# Patient Record
Sex: Female | Born: 1950 | Race: White | Hispanic: No | Marital: Married | Smoking: Former smoker
Health system: Southern US, Community
[De-identification: ages and names within clinical notes are randomized; demographics above are authoritative.]

## PROBLEM LIST (undated history)

## (undated) DIAGNOSIS — C801 Malignant (primary) neoplasm, unspecified: Secondary | ICD-10-CM

## (undated) DIAGNOSIS — M810 Age-related osteoporosis without current pathological fracture: Secondary | ICD-10-CM

## (undated) DIAGNOSIS — I10 Essential (primary) hypertension: Secondary | ICD-10-CM

## (undated) DIAGNOSIS — Z95828 Presence of other vascular implants and grafts: Secondary | ICD-10-CM

## (undated) HISTORY — PX: OTHER SURGICAL HISTORY: SHX169

## (undated) HISTORY — PX: TONSILLECTOMY: SUR1361

## (undated) HISTORY — DX: Presence of other vascular implants and grafts: Z95.828

## (undated) HISTORY — PX: TUMOR EXCISION: SHX421

## (undated) HISTORY — DX: Essential (primary) hypertension: I10

---
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
## 1993-11-18 HISTORY — PX: ABDOMINAL HYSTERECTOMY: SHX81

## 2006-12-12 ENCOUNTER — Ambulatory Visit: Payer: Self-pay | Admitting: Family Medicine

## 2006-12-12 DIAGNOSIS — J069 Acute upper respiratory infection, unspecified: Secondary | ICD-10-CM | POA: Insufficient documentation

## 2006-12-15 ENCOUNTER — Telehealth: Payer: Self-pay | Admitting: Family Medicine

## 2007-01-14 ENCOUNTER — Ambulatory Visit: Payer: Self-pay | Admitting: Family Medicine

## 2007-01-14 LAB — CONVERTED CEMR LAB: Inflenza A Ag: POSITIVE

## 2007-01-21 ENCOUNTER — Ambulatory Visit: Payer: Self-pay | Admitting: Family Medicine

## 2007-07-15 ENCOUNTER — Ambulatory Visit: Payer: Self-pay | Admitting: Family Medicine

## 2007-07-15 DIAGNOSIS — J329 Chronic sinusitis, unspecified: Secondary | ICD-10-CM | POA: Insufficient documentation

## 2007-08-26 ENCOUNTER — Encounter: Payer: Self-pay | Admitting: Family Medicine

## 2007-08-27 ENCOUNTER — Ambulatory Visit: Payer: Self-pay | Admitting: Family Medicine

## 2007-08-27 DIAGNOSIS — M549 Dorsalgia, unspecified: Secondary | ICD-10-CM | POA: Insufficient documentation

## 2007-09-03 ENCOUNTER — Encounter: Admission: RE | Admit: 2007-09-03 | Discharge: 2007-09-03 | Payer: Self-pay | Admitting: Family Medicine

## 2007-09-23 ENCOUNTER — Ambulatory Visit: Payer: Self-pay | Admitting: Family Medicine

## 2007-09-28 ENCOUNTER — Telehealth: Payer: Self-pay | Admitting: Family Medicine

## 2007-09-30 ENCOUNTER — Ambulatory Visit: Payer: Self-pay | Admitting: Family Medicine

## 2007-10-08 ENCOUNTER — Encounter: Payer: Self-pay | Admitting: Family Medicine

## 2007-11-19 HISTORY — PX: NASAL SINUS SURGERY: SHX719

## 2008-02-24 ENCOUNTER — Ambulatory Visit: Payer: Self-pay | Admitting: Family Medicine

## 2008-02-24 DIAGNOSIS — R002 Palpitations: Secondary | ICD-10-CM | POA: Insufficient documentation

## 2008-02-24 DIAGNOSIS — H1045 Other chronic allergic conjunctivitis: Secondary | ICD-10-CM

## 2008-04-27 ENCOUNTER — Ambulatory Visit: Payer: Self-pay | Admitting: Family Medicine

## 2008-04-27 DIAGNOSIS — J441 Chronic obstructive pulmonary disease with (acute) exacerbation: Secondary | ICD-10-CM | POA: Insufficient documentation

## 2008-04-27 DIAGNOSIS — J019 Acute sinusitis, unspecified: Secondary | ICD-10-CM | POA: Insufficient documentation

## 2008-05-02 ENCOUNTER — Encounter: Payer: Self-pay | Admitting: Family Medicine

## 2008-05-03 ENCOUNTER — Encounter: Payer: Self-pay | Admitting: Family Medicine

## 2008-06-13 ENCOUNTER — Encounter: Payer: Self-pay | Admitting: Family Medicine

## 2008-08-24 ENCOUNTER — Encounter: Payer: Self-pay | Admitting: Family Medicine

## 2008-09-21 ENCOUNTER — Encounter: Payer: Self-pay | Admitting: Family Medicine

## 2008-09-27 ENCOUNTER — Ambulatory Visit: Payer: Self-pay | Admitting: Family Medicine

## 2008-12-29 ENCOUNTER — Ambulatory Visit: Payer: Self-pay | Admitting: Family Medicine

## 2009-01-03 ENCOUNTER — Telehealth: Payer: Self-pay | Admitting: Family Medicine

## 2009-01-11 ENCOUNTER — Ambulatory Visit: Payer: Self-pay | Admitting: Family Medicine

## 2009-01-11 ENCOUNTER — Encounter: Admission: RE | Admit: 2009-01-11 | Discharge: 2009-01-11 | Payer: Self-pay | Admitting: Family Medicine

## 2009-01-11 ENCOUNTER — Telehealth: Payer: Self-pay | Admitting: Family Medicine

## 2009-01-11 DIAGNOSIS — R030 Elevated blood-pressure reading, without diagnosis of hypertension: Secondary | ICD-10-CM | POA: Insufficient documentation

## 2009-01-12 ENCOUNTER — Telehealth (INDEPENDENT_AMBULATORY_CARE_PROVIDER_SITE_OTHER): Payer: Self-pay | Admitting: *Deleted

## 2009-01-18 ENCOUNTER — Encounter: Payer: Self-pay | Admitting: Family Medicine

## 2009-01-18 ENCOUNTER — Encounter: Admission: RE | Admit: 2009-01-18 | Discharge: 2009-01-18 | Payer: Self-pay | Admitting: Family Medicine

## 2009-01-19 ENCOUNTER — Telehealth: Payer: Self-pay | Admitting: Family Medicine

## 2009-02-15 ENCOUNTER — Ambulatory Visit: Payer: Self-pay | Admitting: Family Medicine

## 2009-03-01 ENCOUNTER — Ambulatory Visit: Payer: Self-pay | Admitting: Family Medicine

## 2009-03-01 DIAGNOSIS — M25519 Pain in unspecified shoulder: Secondary | ICD-10-CM

## 2009-03-02 ENCOUNTER — Telehealth (INDEPENDENT_AMBULATORY_CARE_PROVIDER_SITE_OTHER): Payer: Self-pay | Admitting: *Deleted

## 2009-04-13 ENCOUNTER — Telehealth (INDEPENDENT_AMBULATORY_CARE_PROVIDER_SITE_OTHER): Payer: Self-pay | Admitting: *Deleted

## 2009-04-20 ENCOUNTER — Telehealth: Payer: Self-pay | Admitting: Family Medicine

## 2009-05-04 ENCOUNTER — Telehealth: Payer: Self-pay | Admitting: Family Medicine

## 2009-05-04 DIAGNOSIS — R599 Enlarged lymph nodes, unspecified: Secondary | ICD-10-CM | POA: Insufficient documentation

## 2009-05-05 ENCOUNTER — Encounter: Payer: Self-pay | Admitting: Family Medicine

## 2009-05-05 ENCOUNTER — Encounter: Admission: RE | Admit: 2009-05-05 | Discharge: 2009-05-05 | Payer: Self-pay | Admitting: Family Medicine

## 2009-05-05 LAB — CONVERTED CEMR LAB
BUN: 10 mg/dL (ref 6–23)
Creatinine, Ser: 0.8 mg/dL (ref 0.40–1.20)

## 2009-05-17 ENCOUNTER — Encounter: Payer: Self-pay | Admitting: Family Medicine

## 2009-05-18 LAB — CONVERTED CEMR LAB
Cholesterol, target level: 200 mg/dL
Cholesterol: 239 mg/dL — ABNORMAL HIGH (ref 0–200)
HDL: 63 mg/dL (ref >39)
Total CHOL/HDL Ratio: 3.8
VLDL: 35 mg/dL (ref 0–40)

## 2010-03-08 ENCOUNTER — Encounter: Payer: Self-pay | Admitting: Family Medicine

## 2010-12-18 NOTE — Letter (Signed)
Summary: [facility]  [facility]   Imported By: Lanelle Bal 04/05/2010 08:54:28  _____________________________________________________________________  External Attachment:    Type:   Image     Comment:   External Document

## 2011-04-24 LAB — HM DEXA SCAN

## 2012-01-06 DIAGNOSIS — C159 Malignant neoplasm of esophagus, unspecified: Secondary | ICD-10-CM | POA: Insufficient documentation

## 2012-04-17 ENCOUNTER — Emergency Department (INDEPENDENT_AMBULATORY_CARE_PROVIDER_SITE_OTHER)
Admission: EM | Admit: 2012-04-17 | Discharge: 2012-04-17 | Disposition: A | Discharge status: Home / Self Care | Payer: BC Managed Care – PPO | Source: Home / Self Care | Attending: Emergency Medicine | Admitting: Emergency Medicine

## 2012-04-17 ENCOUNTER — Encounter: Payer: Self-pay | Admitting: Emergency Medicine

## 2012-04-17 DIAGNOSIS — B3731 Acute candidiasis of vulva and vagina: Secondary | ICD-10-CM

## 2012-04-17 DIAGNOSIS — B373 Candidiasis of vulva and vagina: Secondary | ICD-10-CM

## 2012-04-17 HISTORY — DX: Malignant (primary) neoplasm, unspecified: C80.1

## 2012-04-17 LAB — POCT URINALYSIS DIP (MANUAL ENTRY)
Blood, UA: NEGATIVE
Glucose, UA: NEGATIVE
Ketones, POC UA: NEGATIVE
Nitrite, UA: NEGATIVE
Spec Grav, UA: >=1.03 (ref 1.005–1.03)
pH, UA: 5 (ref 5–8)

## 2012-04-17 MED ORDER — FLUCONAZOLE 150 MG PO TABS: ORAL_TABLET | ORAL | Status: DC

## 2012-04-17 NOTE — ED Provider Notes (Signed)
History     CSN: [id number]  Arrival date & time 04/17/12  1659   First MD Initiated Contact with Patient 04/17/12 1659      Chief Complaint  Patient presents with  . Dysuria    (Consider location/radiation/quality/duration/timing/severity/associated sxs/prior treatment) HPI Morgan Butler is a 61 y.o. female who presents today with UTI symptoms for 1 days.  + dysuria (constant, not just with urination) + frequency No urgency No hematuria No vaginal discharge No fever/chills No lower abdominal pain No back pain No fatigue   Past Medical History  Diagnosis Date  . Cancer     Past Surgical History  Procedure Date  . Appendectomy   . Abdominal hysterectomy   . Tonsillectomy   . Left shoulder   . Nasal sinus surgery     No family history on file.  History  Substance Use Topics  . Smoking status: Former Games developer  . Smokeless tobacco: Not on file  . Alcohol Use: Yes    OB History    Grav Para Term Preterm Abortions TAB SAB Ect Mult Living                  Review of Systems  All other systems reviewed and are negative.    Allergies  Review of patient's allergies indicates no known allergies.  Home Medications   Current Outpatient Rx  Name Route Sig Dispense Refill  . FLUCONAZOLE 150 MG PO TABS  May repeat in 3 days 2 tablet 0    BP 132/85  Pulse 73  Temp(Src) 98.6 F (37 C) (Oral)  Resp 16  Ht 5\' 3"  (1.6 m)  Wt 153 lb (69.4 kg)  BMI 27.10 kg/m2  SpO2 98%  Physical Exam  Nursing note and vitals reviewed. Constitutional: She is oriented to person, place, and time. She appears well-developed and well-nourished.  HENT:  Head: Normocephalic and atraumatic.  Eyes: No scleral icterus.  Neck: Neck supple.  Cardiovascular: Regular rhythm and normal heart sounds.   Pulmonary/Chest: Effort normal and breath sounds normal. No respiratory distress.  Abdominal: Soft. Normal appearance and bowel sounds are normal. She exhibits no mass. There is no rebound, no  guarding and no CVA tenderness.  Neurological: She is alert and oriented to person, place, and time.  Skin: Skin is warm and dry.  Psychiatric: She has a normal mood and affect. Her speech is normal.    ED Course  Procedures (including critical care time)   Labs Reviewed  POCT URINALYSIS DIP (MANUAL ENTRY)   No results found.   1. Yeast vaginitis       MDM  1) the urine appeared to be clean.  Likely his yeast vaginitis.  We'll treat with Diflucan.  A urine culture is pending.  The differential diagnosis includes bacterial vaginitis. 2) A urinalysis was done in clinic.  A urine culture is pending. 3) Follow up with your PCP or urologist if not improving or if worsening symptoms.   Marlaine Hind, MD 04/17/12 [phone]

## 2012-04-19 LAB — URINE CULTURE: Colony Count: >=100000

## 2012-04-22 ENCOUNTER — Telehealth: Payer: Self-pay | Admitting: Emergency Medicine

## 2012-05-16 ENCOUNTER — Emergency Department (INDEPENDENT_AMBULATORY_CARE_PROVIDER_SITE_OTHER)
Admission: EM | Admit: 2012-05-16 | Discharge: 2012-05-16 | Disposition: A | Discharge status: Home / Self Care | Payer: BC Managed Care – PPO | Source: Home / Self Care | Attending: Emergency Medicine | Admitting: Emergency Medicine

## 2012-05-16 ENCOUNTER — Emergency Department
Admit: 2012-05-16 | Discharge: 2012-05-16 | Disposition: A | Discharge status: Home / Self Care | Payer: BC Managed Care – PPO

## 2012-05-16 DIAGNOSIS — R079 Chest pain, unspecified: Secondary | ICD-10-CM

## 2012-05-16 DIAGNOSIS — J329 Chronic sinusitis, unspecified: Secondary | ICD-10-CM

## 2012-05-16 DIAGNOSIS — R0781 Pleurodynia: Secondary | ICD-10-CM

## 2012-05-16 DIAGNOSIS — J069 Acute upper respiratory infection, unspecified: Secondary | ICD-10-CM

## 2012-05-16 IMAGING — CR DG RIBS W/ CHEST 3+V*L*
3 series · 3 of 3 positions shown · non-contrast
Comparison: [DATE]

CLINICAL DATA: Left rib pain.  Assess for fracture.

LEFT RIBS AND CHEST - 3+ VIEW

[view not recorded (1 of 3)]
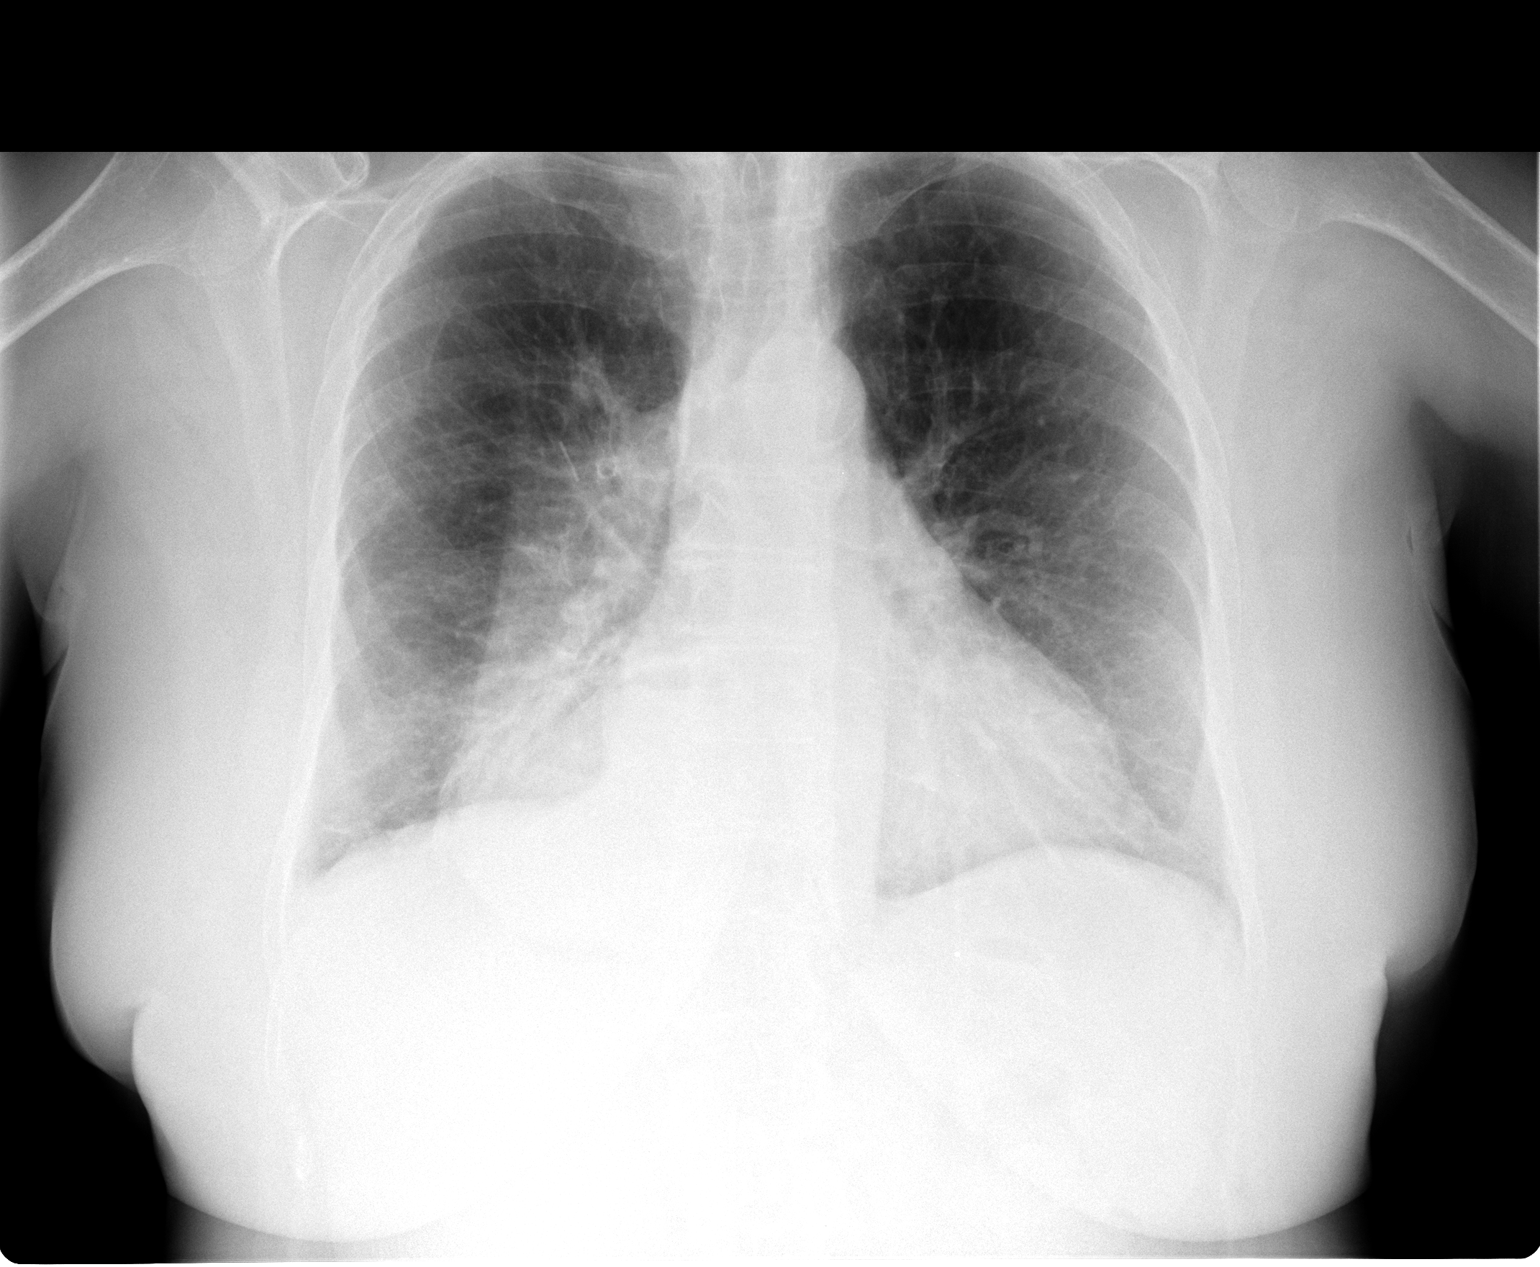

[view not recorded (2 of 3)]
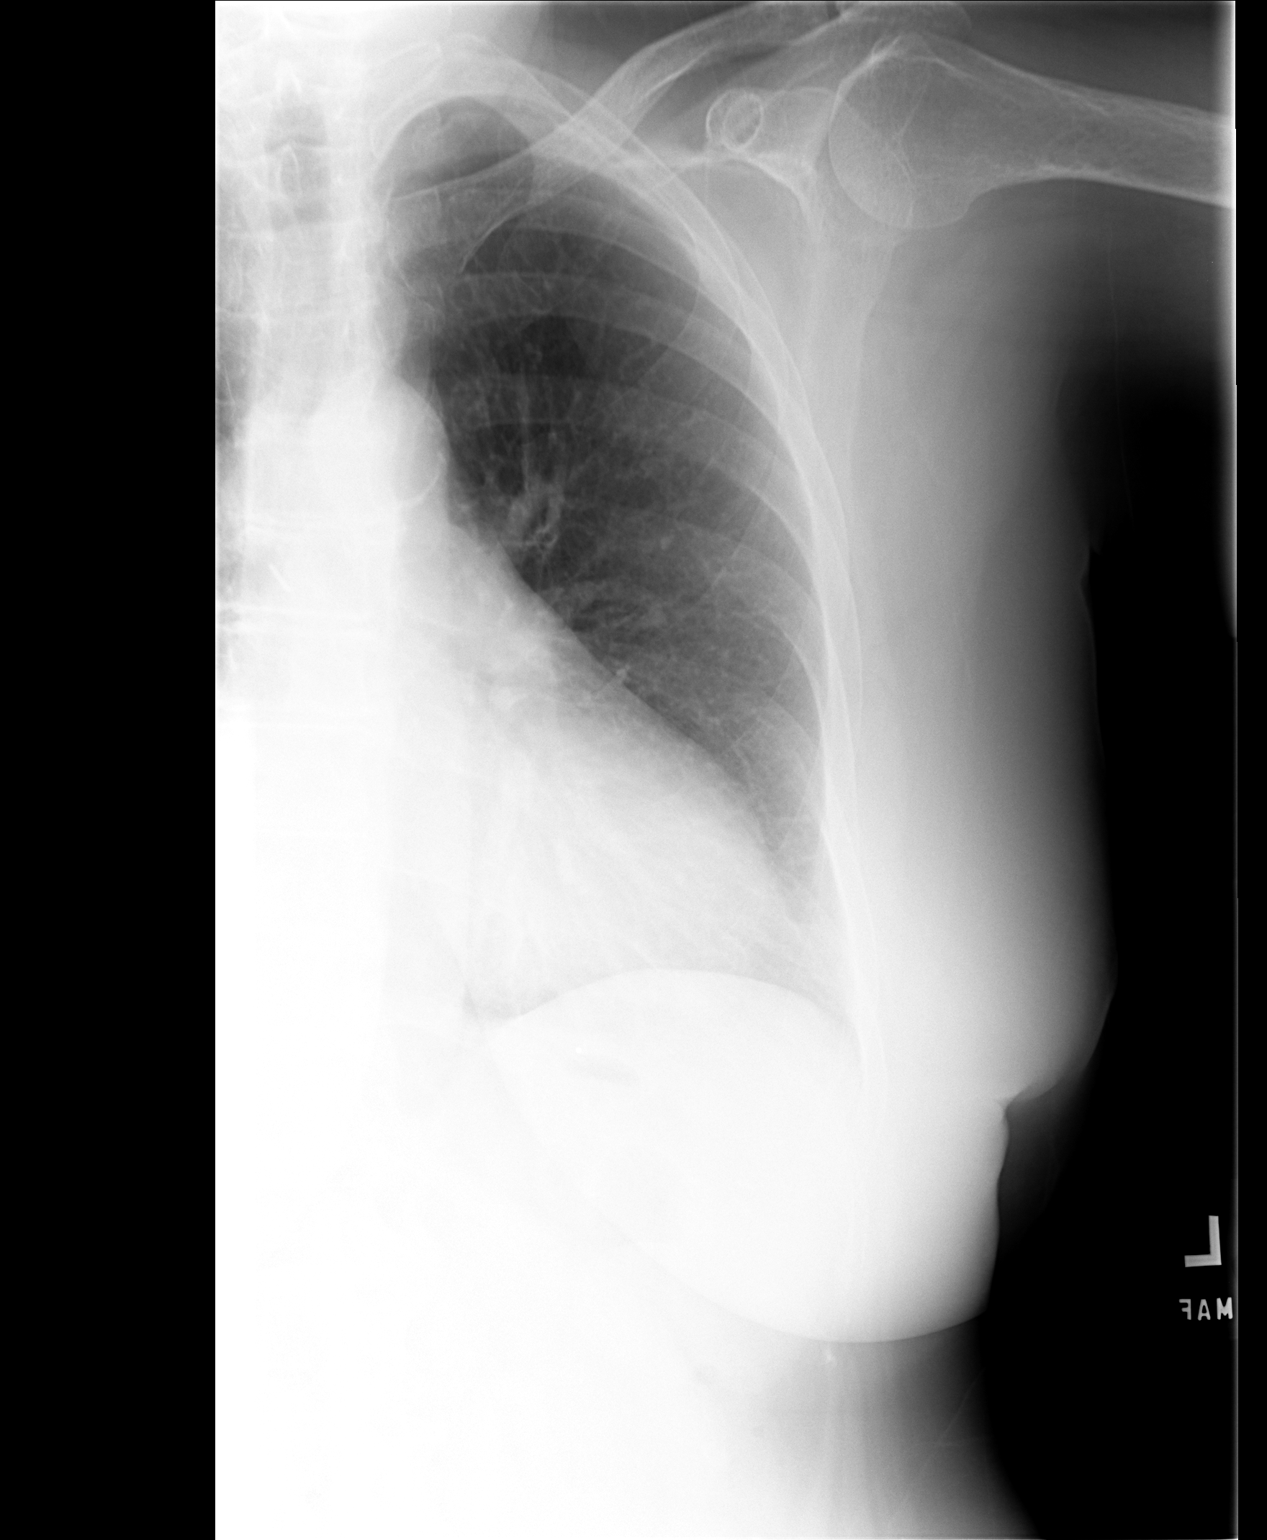

[view not recorded (3 of 3)]
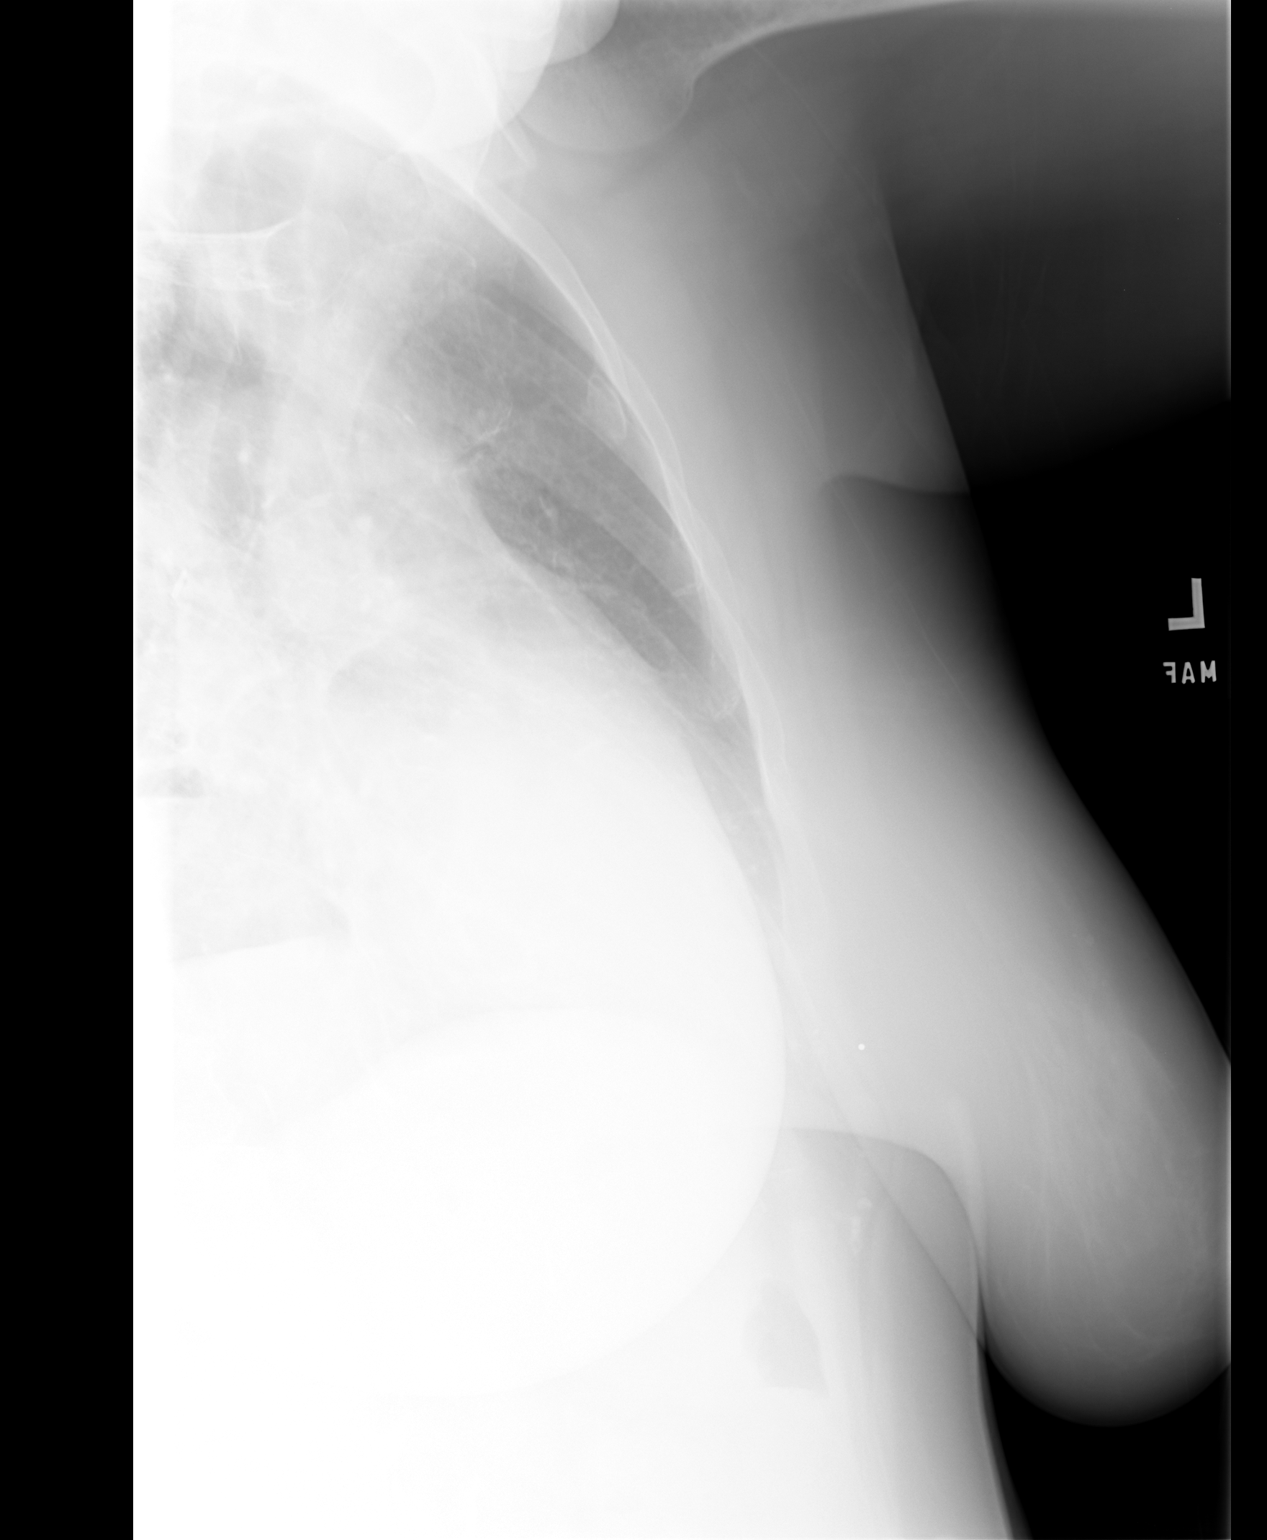

[3 of 3 positions shown; findings below may reference images not displayed]

FINDINGS: Heart size is normal.

No pleural effusion or edema.

Postoperative change compatible with partial esophagectomy and
gastric pull-through is noted.

There is no airspace consolidation.

Rib detail is somewhat diminished secondary to body habitus.  The
coned-down views of the left ribs show no displaced rib fractures.
IMPRESSION: 1.  No acute findings.

## 2012-05-16 MED ORDER — AMOXICILLIN-POT CLAVULANATE 875-125 MG PO TABS: 1.0000 | ORAL_TABLET | Freq: Two times a day (BID) | ORAL | Status: AC

## 2012-05-16 NOTE — ED Provider Notes (Signed)
History     CSN: [id number]  Arrival date & time 05/16/12  0908   First MD Initiated Contact with Patient 05/16/12 0914      Chief Complaint  Patient presents with  . Facial Pain  . Rib Injury    (Consider location/radiation/quality/duration/timing/severity/associated sxs/prior treatment) HPI Morgan Butler is a 61 y.o. female who complains of onset of cold symptoms for a few weeks.  The symptoms are constant and mild-moderate in severity.  She has been using any pot and over-the-counter sinus medicine which has been helping a little bit.  No sore throat No cough No pleuritic pain No wheezing + nasal congestion + post-nasal drainage + sinus pain/pressure No chest congestion No itchy/red eyes No earache No hemoptysis No SOB + chills/sweats No fever No nausea No vomiting No abdominal pain No diarrhea No skin rashes No fatigue No myalgias No headache    2) Her second complaint today is left-sided chest pain.  She was playing with her grandchild and was laying on the ground.  She rolled over and thinks she heard a snap.  Since then she's been having left rib pain focally.  She describes it as a sharp pain and is worse with deep breaths.  She reports having osteoporosis.  No difficulty breathing, radiation of pain, abdominal pain.  No cardiac symptoms or distress.  Past Medical History  Diagnosis Date  . Cancer     Past Surgical History  Procedure Date  . Appendectomy   . Abdominal hysterectomy   . Tonsillectomy   . Left shoulder   . Nasal sinus surgery     History reviewed. No pertinent family history.  History  Substance Use Topics  . Smoking status: Former Games developer  . Smokeless tobacco: Not on file  . Alcohol Use: Yes    OB History    Grav Para Term Preterm Abortions TAB SAB Ect Mult Living                  Review of Systems  All other systems reviewed and are negative.    Allergies  Review of patient's allergies indicates no known allergies.  Home  Medications   Current Outpatient Rx  Name Route Sig Dispense Refill  . AMOXICILLIN-POT CLAVULANATE 875-125 MG PO TABS Oral Take 1 tablet by mouth 2 (two) times daily. 14 tablet 0  . FLUCONAZOLE 150 MG PO TABS  May repeat in 3 days 2 tablet 0    BP 152/83  Pulse 60  Temp 98.4 F (36.9 C) (Oral)  Resp 18  Ht 5' 2.5" (1.588 m)  Wt 155 lb 8 oz (70.534 kg)  BMI 27.99 kg/m2  SpO2 97%  Physical Exam  Nursing note and vitals reviewed. Constitutional: She is oriented to person, place, and time. She appears well-developed and well-nourished.  HENT:  Head: Normocephalic and atraumatic.  Right Ear: Tympanic membrane, external ear and ear canal normal.  Left Ear: Tympanic membrane, external ear and ear canal normal.  Nose: Mucosal edema and rhinorrhea present.  Mouth/Throat: Posterior oropharyngeal erythema present. No oropharyngeal exudate or posterior oropharyngeal edema.  Eyes: No scleral icterus.  Neck: Neck supple.  Cardiovascular: Regular rhythm and normal heart sounds.   Pulmonary/Chest: Effort normal and breath sounds normal. No respiratory distress. She has no decreased breath sounds. She has no wheezes. She has no rhonchi.    Neurological: She is alert and oriented to person, place, and time.  Skin: Skin is warm and dry.  Psychiatric: She has a normal mood and  affect. Her speech is normal.    ED Course  Procedures (including critical care time)  Labs Reviewed - No data to display Dg Ribs Unilateral W/chest Left  05/16/2012  *RADIOLOGY REPORT*  Clinical Data: Left rib pain.  Assess for fracture.  LEFT RIBS AND CHEST - 3+ VIEW  Comparison: 05/05/2009  Findings: Heart size is normal.  No pleural effusion or edema.  Postoperative change compatible with partial esophagectomy and gastric pull-through is noted.  There is no airspace consolidation.  Rib detail is somewhat diminished secondary to body habitus.  The coned-down views of the left ribs show no displaced rib fractures.   IMPRESSION:  1.  No acute findings.  Original Report Authenticated By: Rosealee Albee, M.D.     1. Rib pain   2. Sinusitis   3. Acute upper respiratory infections of unspecified site       MDM  1)  Take the prescribed antibiotic as instructed. Use nasal saline solution (over the counter) at least 3 times a day. Use over the counter decongestants like Zyrtec-D every 12 hours as needed to help with congestion.  If you have hypertension, do not take medicines with sudafed. Can take tylenol every 6 hours or motrin every 8 hours for pain or fever.Follow up with your primary doctor if no improvement in 5-7 days, sooner if increasing pain, fever, or new symptoms.    2)  For her rib pain, since she is postmenopausal with osteoporosis, we did obtain an x-ray and is read by radiology as above.  Because of her possible susceptibility to pneumonia, I did not put her in a rib belt. Motrin or Aleve and continue deep breaths.  Follow up with her PCP if not improving or to ER if worsening pain or symptoms.   Marlaine Hind, MD 05/16/12 1028

## 2012-05-16 NOTE — ED Notes (Signed)
Hx of sinus problems, states it is just getting worse.  Also states she rolled over last night and heard a pop now c/o left rib pain

## 2012-07-27 ENCOUNTER — Emergency Department (INDEPENDENT_AMBULATORY_CARE_PROVIDER_SITE_OTHER)
Admission: EM | Admit: 2012-07-27 | Discharge: 2012-07-27 | Disposition: A | Discharge status: Home / Self Care | Payer: BC Managed Care – PPO | Source: Home / Self Care

## 2012-07-27 ENCOUNTER — Encounter: Payer: Self-pay | Admitting: *Deleted

## 2012-07-27 DIAGNOSIS — J329 Chronic sinusitis, unspecified: Secondary | ICD-10-CM

## 2012-07-27 HISTORY — DX: Age-related osteoporosis without current pathological fracture: M81.0

## 2012-07-27 MED ORDER — AZITHROMYCIN 250 MG PO TABS: ORAL_TABLET | ORAL | Status: AC

## 2012-07-27 MED ORDER — PREDNISONE 50 MG PO TABS: ORAL_TABLET | ORAL | Status: AC

## 2012-07-27 NOTE — ED Notes (Signed)
X 3 weeks, patient c/o productive cough, nasal congestion, HA, sinus pain and drainage. Taken IBF, Theraflu, allegra and Nyquil OTC

## 2012-07-27 NOTE — ED Provider Notes (Signed)
History     CSN: [id number]  Arrival date & time 07/27/12  1547   First MD Initiated Contact with Patient 07/27/12 1551      Chief Complaint  Patient presents with  . Sinusitis  . Cough   HPI SINUSITIS Onset:  2-3 weeks Location: frontal sinuses Description:nasal congestion, sinus pressure, post nasal drip, cough  Modifying factors: no  Symptoms Cough:  yes Discharge:  mild Fever: no Sinus Pressure:  yes Ears Blocked:  no Teeth Ache:  no Frontal Headache:  mild Second Sickening:  no  Red Flags Change in mental state: no Change in vision: no    Past Medical History  Diagnosis Date  . Cancer     esophageal  . Osteoporosis     Past Surgical History  Procedure Date  . Appendectomy   . Abdominal hysterectomy   . Tonsillectomy   . Left shoulder   . Nasal sinus surgery   . Tumor excision     Family History  Problem Relation Age of Onset  . Kidney failure Mother     History  Substance Use Topics  . Smoking status: Former Games developer  . Smokeless tobacco: Not on file  . Alcohol Use: Yes    OB History    Grav Para Term Preterm Abortions TAB SAB Ect Mult Living                  Review of Systems  All other systems reviewed and are negative.    Allergies  Review of patient's allergies indicates no known allergies.  Home Medications   Current Outpatient Rx  Name Route Sig Dispense Refill  . CALCIUM-VITAMIN D 250-125 MG-UNIT PO TABS Oral Take 1 tablet by mouth daily.    Marland Kitchen FLUCONAZOLE 150 MG PO TABS  May repeat in 3 days 2 tablet 0    BP 148/65  Pulse 57  Temp 98 F (36.7 C) (Oral)  Resp 14  Ht 5' 2.5" (1.588 m)  Wt 157 lb (71.215 kg)  BMI 28.26 kg/m2  SpO2 99%  Physical Exam  Constitutional: She appears well-developed and well-nourished.  HENT:  Head: Normocephalic and atraumatic.  Right Ear: External ear normal.  Left Ear: External ear normal.       +nasal erythema, rhinorrhea bilaterally, + post oropharyngeal erythema  + maxillary  TTP   Eyes: Conjunctivae are normal. Pupils are equal, round, and reactive to light.  Neck: Normal range of motion. Neck supple.  Cardiovascular: Normal rate and regular rhythm.   Pulmonary/Chest: Effort normal and breath sounds normal. She has no wheezes. She has no rales.  Abdominal: Soft. Bowel sounds are normal.  Musculoskeletal: Normal range of motion.  Lymphadenopathy:    She has no cervical adenopathy.  Skin: Skin is warm.    ED Course  Procedures (including critical care time)  Labs Reviewed - No data to display No results found.   1. Sinusitis       MDM  Will clinically treat with zpak and prednisone.  Suspect undelrying allergic component with sxs. Be sure to take allegra daily.  Discussed infectious and resp red flags.  Follow up as needed.     The patient and/or caregiver has been counseled thoroughly with regard to treatment plan and/or medications prescribed including dosage, schedule, interactions, rationale for use, and possible side effects and they verbalize understanding. Diagnoses and expected course of recovery discussed and will return if not improved as expected or if the condition worsens. Patient and/or caregiver verbalized understanding.  Doree Albee, MD 07/27/12 [phone]

## 2012-07-29 LAB — HM DEXA SCAN

## 2012-11-18 HISTORY — PX: CORONARY STENT PLACEMENT: SHX1402

## 2013-09-29 DIAGNOSIS — Z959 Presence of cardiac and vascular implant and graft, unspecified: Secondary | ICD-10-CM | POA: Insufficient documentation

## 2013-10-01 DIAGNOSIS — I259 Chronic ischemic heart disease, unspecified: Secondary | ICD-10-CM | POA: Insufficient documentation

## 2014-06-29 DIAGNOSIS — I2510 Atherosclerotic heart disease of native coronary artery without angina pectoris: Secondary | ICD-10-CM | POA: Insufficient documentation

## 2014-06-29 DIAGNOSIS — Z955 Presence of coronary angioplasty implant and graft: Secondary | ICD-10-CM | POA: Insufficient documentation

## 2014-06-29 DIAGNOSIS — R0602 Shortness of breath: Secondary | ICD-10-CM | POA: Insufficient documentation

## 2014-06-29 DIAGNOSIS — I10 Essential (primary) hypertension: Secondary | ICD-10-CM | POA: Insufficient documentation

## 2014-09-07 DIAGNOSIS — G959 Disease of spinal cord, unspecified: Secondary | ICD-10-CM | POA: Insufficient documentation

## 2014-09-07 DIAGNOSIS — M48061 Spinal stenosis, lumbar region without neurogenic claudication: Secondary | ICD-10-CM | POA: Insufficient documentation

## 2014-09-21 DIAGNOSIS — M47812 Spondylosis without myelopathy or radiculopathy, cervical region: Secondary | ICD-10-CM | POA: Insufficient documentation

## 2014-10-26 LAB — HM MAMMOGRAPHY

## 2014-12-20 DIAGNOSIS — M549 Dorsalgia, unspecified: Secondary | ICD-10-CM | POA: Diagnosis not present

## 2014-12-20 DIAGNOSIS — J329 Chronic sinusitis, unspecified: Secondary | ICD-10-CM | POA: Diagnosis not present

## 2015-02-09 DIAGNOSIS — I5030 Unspecified diastolic (congestive) heart failure: Secondary | ICD-10-CM | POA: Diagnosis not present

## 2015-02-09 DIAGNOSIS — I2510 Atherosclerotic heart disease of native coronary artery without angina pectoris: Secondary | ICD-10-CM | POA: Diagnosis not present

## 2015-02-09 DIAGNOSIS — M5441 Lumbago with sciatica, right side: Secondary | ICD-10-CM | POA: Diagnosis not present

## 2015-02-09 DIAGNOSIS — M779 Enthesopathy, unspecified: Secondary | ICD-10-CM | POA: Diagnosis not present

## 2015-02-09 DIAGNOSIS — I10 Essential (primary) hypertension: Secondary | ICD-10-CM | POA: Diagnosis not present

## 2015-02-09 DIAGNOSIS — K210 Gastro-esophageal reflux disease with esophagitis: Secondary | ICD-10-CM | POA: Diagnosis not present

## 2015-02-09 DIAGNOSIS — E785 Hyperlipidemia, unspecified: Secondary | ICD-10-CM | POA: Diagnosis not present

## 2015-03-27 DIAGNOSIS — I2510 Atherosclerotic heart disease of native coronary artery without angina pectoris: Secondary | ICD-10-CM | POA: Diagnosis not present

## 2015-03-27 DIAGNOSIS — M5441 Lumbago with sciatica, right side: Secondary | ICD-10-CM | POA: Diagnosis not present

## 2015-03-27 DIAGNOSIS — I5030 Unspecified diastolic (congestive) heart failure: Secondary | ICD-10-CM | POA: Diagnosis not present

## 2015-03-27 DIAGNOSIS — K210 Gastro-esophageal reflux disease with esophagitis: Secondary | ICD-10-CM | POA: Diagnosis not present

## 2015-03-28 DIAGNOSIS — R0789 Other chest pain: Secondary | ICD-10-CM | POA: Diagnosis not present

## 2015-03-28 DIAGNOSIS — K210 Gastro-esophageal reflux disease with esophagitis: Secondary | ICD-10-CM | POA: Diagnosis not present

## 2015-06-09 DIAGNOSIS — G8929 Other chronic pain: Secondary | ICD-10-CM | POA: Diagnosis not present

## 2015-06-09 DIAGNOSIS — M545 Low back pain: Secondary | ICD-10-CM | POA: Diagnosis not present

## 2015-06-09 DIAGNOSIS — I10 Essential (primary) hypertension: Secondary | ICD-10-CM | POA: Diagnosis not present

## 2015-06-09 DIAGNOSIS — I2510 Atherosclerotic heart disease of native coronary artery without angina pectoris: Secondary | ICD-10-CM | POA: Diagnosis not present

## 2015-06-09 DIAGNOSIS — K210 Gastro-esophageal reflux disease with esophagitis: Secondary | ICD-10-CM | POA: Diagnosis not present

## 2015-06-09 DIAGNOSIS — M810 Age-related osteoporosis without current pathological fracture: Secondary | ICD-10-CM | POA: Diagnosis not present

## 2015-06-09 DIAGNOSIS — E785 Hyperlipidemia, unspecified: Secondary | ICD-10-CM | POA: Diagnosis not present

## 2015-06-12 DIAGNOSIS — E785 Hyperlipidemia, unspecified: Secondary | ICD-10-CM | POA: Diagnosis not present

## 2015-06-12 DIAGNOSIS — I5030 Unspecified diastolic (congestive) heart failure: Secondary | ICD-10-CM | POA: Diagnosis not present

## 2015-07-04 DIAGNOSIS — Z955 Presence of coronary angioplasty implant and graft: Secondary | ICD-10-CM | POA: Diagnosis not present

## 2015-07-04 DIAGNOSIS — I10 Essential (primary) hypertension: Secondary | ICD-10-CM | POA: Diagnosis not present

## 2015-07-04 DIAGNOSIS — I2583 Coronary atherosclerosis due to lipid rich plaque: Secondary | ICD-10-CM | POA: Diagnosis not present

## 2015-07-04 DIAGNOSIS — I2510 Atherosclerotic heart disease of native coronary artery without angina pectoris: Secondary | ICD-10-CM | POA: Diagnosis not present

## 2015-08-18 DIAGNOSIS — K219 Gastro-esophageal reflux disease without esophagitis: Secondary | ICD-10-CM | POA: Insufficient documentation

## 2015-08-18 DIAGNOSIS — Z791 Long term (current) use of non-steroidal anti-inflammatories (NSAID): Secondary | ICD-10-CM | POA: Diagnosis not present

## 2015-08-18 DIAGNOSIS — I2510 Atherosclerotic heart disease of native coronary artery without angina pectoris: Secondary | ICD-10-CM | POA: Diagnosis not present

## 2015-08-18 DIAGNOSIS — R079 Chest pain, unspecified: Secondary | ICD-10-CM | POA: Diagnosis not present

## 2015-08-18 DIAGNOSIS — Z955 Presence of coronary angioplasty implant and graft: Secondary | ICD-10-CM | POA: Diagnosis not present

## 2015-08-18 DIAGNOSIS — E785 Hyperlipidemia, unspecified: Secondary | ICD-10-CM | POA: Insufficient documentation

## 2015-08-18 DIAGNOSIS — I10 Essential (primary) hypertension: Secondary | ICD-10-CM | POA: Diagnosis not present

## 2015-08-18 DIAGNOSIS — Z959 Presence of cardiac and vascular implant and graft, unspecified: Secondary | ICD-10-CM | POA: Diagnosis not present

## 2015-08-18 DIAGNOSIS — Z8501 Personal history of malignant neoplasm of esophagus: Secondary | ICD-10-CM | POA: Diagnosis not present

## 2015-08-18 DIAGNOSIS — Z79899 Other long term (current) drug therapy: Secondary | ICD-10-CM | POA: Diagnosis not present

## 2015-08-18 DIAGNOSIS — Z7982 Long term (current) use of aspirin: Secondary | ICD-10-CM | POA: Diagnosis not present

## 2015-08-18 DIAGNOSIS — Z882 Allergy status to sulfonamides status: Secondary | ICD-10-CM | POA: Diagnosis not present

## 2015-08-18 DIAGNOSIS — R072 Precordial pain: Secondary | ICD-10-CM | POA: Diagnosis not present

## 2015-08-19 DIAGNOSIS — K219 Gastro-esophageal reflux disease without esophagitis: Secondary | ICD-10-CM | POA: Diagnosis not present

## 2015-08-19 DIAGNOSIS — I517 Cardiomegaly: Secondary | ICD-10-CM | POA: Diagnosis not present

## 2015-08-19 DIAGNOSIS — Z959 Presence of cardiac and vascular implant and graft, unspecified: Secondary | ICD-10-CM | POA: Diagnosis not present

## 2015-08-19 DIAGNOSIS — R079 Chest pain, unspecified: Secondary | ICD-10-CM | POA: Diagnosis not present

## 2015-08-19 DIAGNOSIS — E785 Hyperlipidemia, unspecified: Secondary | ICD-10-CM | POA: Diagnosis not present

## 2015-08-19 LAB — BASIC METABOLIC PANEL
BUN: 13 mg/dL (ref 4–21)
Creatinine: 0.8 mg/dL (ref 0.5–1.1)
Glucose: 106 mg/dL
POTASSIUM: 4.6 mmol/L (ref 3.4–5.3)
SODIUM: 141 mmol/L (ref 137–147)

## 2015-08-19 LAB — LIPID PANEL
CHOLESTEROL: 148 mg/dL (ref 0–200)
HDL: 58 mg/dL (ref 35–70)
LDL Cholesterol: 68 mg/dL
TRIGLYCERIDES: 112 mg/dL (ref 40–160)

## 2015-08-19 LAB — HEMOGLOBIN A1C: HEMOGLOBIN A1C: 5 % (ref 4.0–6.0)

## 2015-08-19 LAB — TSH: TSH: 1.52 u[IU]/mL (ref 0.41–5.90)

## 2015-08-22 DIAGNOSIS — I2583 Coronary atherosclerosis due to lipid rich plaque: Secondary | ICD-10-CM | POA: Diagnosis not present

## 2015-08-22 DIAGNOSIS — Z955 Presence of coronary angioplasty implant and graft: Secondary | ICD-10-CM | POA: Diagnosis not present

## 2015-08-22 DIAGNOSIS — I2510 Atherosclerotic heart disease of native coronary artery without angina pectoris: Secondary | ICD-10-CM | POA: Diagnosis not present

## 2015-08-22 DIAGNOSIS — I10 Essential (primary) hypertension: Secondary | ICD-10-CM | POA: Diagnosis not present

## 2015-09-07 ENCOUNTER — Ambulatory Visit (INDEPENDENT_AMBULATORY_CARE_PROVIDER_SITE_OTHER): Payer: Medicare Other | Admitting: Family Medicine

## 2015-09-07 ENCOUNTER — Encounter: Payer: Self-pay | Admitting: Family Medicine

## 2015-09-07 VITALS — BP 131/80 | HR 67 | Temp 98.9°F | Resp 18 | Ht 62.5 in | Wt 151.0 lb

## 2015-09-07 DIAGNOSIS — Z780 Asymptomatic menopausal state: Secondary | ICD-10-CM

## 2015-09-07 DIAGNOSIS — N951 Menopausal and female climacteric states: Secondary | ICD-10-CM

## 2015-09-07 DIAGNOSIS — R0602 Shortness of breath: Secondary | ICD-10-CM

## 2015-09-07 DIAGNOSIS — Z23 Encounter for immunization: Secondary | ICD-10-CM

## 2015-09-07 DIAGNOSIS — R0789 Other chest pain: Secondary | ICD-10-CM

## 2015-09-07 MED ORDER — NAPROXEN 500 MG PO TABS: 500.0000 mg | ORAL_TABLET | Freq: Two times a day (BID) | ORAL | Status: DC

## 2015-09-07 MED ORDER — ALBUTEROL SULFATE (2.5 MG/3ML) 0.083% IN NEBU
2.5000 mg | INHALATION_SOLUTION | Freq: Once | RESPIRATORY_TRACT | Status: AC
  Administered 2015-09-07: 2.5 mg via RESPIRATORY_TRACT

## 2015-09-07 MED ORDER — ATORVASTATIN CALCIUM 40 MG PO TABS: 40.0000 mg | ORAL_TABLET | Freq: Every day | ORAL | Status: AC

## 2015-09-07 NOTE — Patient Instructions (Signed)
Take the Naprosyn daily with food and water for about 7-10 days to help with the chest pain.  Please schedule an appointment in 1-2 weeks for spirometry which is a type of breathing test.

## 2015-09-07 NOTE — Progress Notes (Signed)
Subjective:    Patient ID: Morgan Butler, female    DOB: 04-Apr-1951, 64 y.o.   MRN: [id number]  HPI   Here to re-estab care. Last seen 5 years ago. She is also here for hospital F/U.     3 weeks of atypical chest pain.went to Ed about 2 weeks ago for these sxs and had a neg cardiac w/u and neg CXR.  She was D/c'd home but is concerned bc she is still having the pain.    Says radiates into her back. She feels SOB with activity.  Chest pain is coming and going. She says it's really not painful it's more by pressure. Change in position does not seem to aggravate it. Does have a history of reflux. No know triggers or worsening factors for the chest pain.  Feels SOB even goint to the bathroom.  Had recent stress test that was normal.   She's been using her husband's home spirometer and says he she's only been able to blow 250. No hx o asthma as a child.  No wheezing   Or cough. No recent URI sxs.  Per care everywhere chart her chest x-ray which was performed 3 weeks ago was normal.  Hospital notes reviewed from [facility].    We have COPD listed on her old chart.    Review of Systems   BP 131/80 mmHg  Pulse 67  Temp(Src) 98.9 F (37.2 C) (Oral)  Resp 18  Ht 5' 2.5" (1.588 m)  Wt 151 lb (68.493 kg)  BMI 27.16 kg/m2  SpO2 99%  PF 270 L/min    Allergies  Allergen Reactions  . Sulfa Antibiotics Rash    Past Medical History  Diagnosis Date  . Cancer (HCC)     esophageal  . Osteoporosis   . History of intravascular stent placement   . Hypertension     Past Surgical History  Procedure Laterality Date  . Appendectomy    . Abdominal hysterectomy    . Tonsillectomy    . Left shoulder    . Nasal sinus surgery    . Tumor excision    . Coronary stent placement  2014    Social History   Social History  . Marital Status: Married    Spouse Name: Morgan Butler  . Number of Children: N/A  . Years of Education: 12   Occupational History  . Retired    Social  History Main Topics  . Smoking status: Former Smoker    Quit date: 11/18/1993  . Smokeless tobacco: Not on file  . Alcohol Use: No  . Drug Use: No  . Sexual Activity: Yes   Other Topics Concern  . Not on file   Social History Narrative   Tired. She quit smoking in 1995. No alcohol use.    Family History  Problem Relation Age of Onset  . Kidney failure Mother     Outpatient Encounter Prescriptions as of 09/07/2015  Medication Sig  . acetaminophen (TYLENOL) 650 MG CR tablet Take 650 mg by mouth.  Marland Kitchen aspirin 81 MG chewable tablet Chew 81 mg by mouth.  Marland Kitchen atorvastatin (LIPITOR) 40 MG tablet Take 1 tablet (40 mg total) by mouth at bedtime.  . cholecalciferol (VITAMIN D) 1000 UNITS tablet Take 2,000 Units by mouth daily.  . [DISCONTINUED] atorvastatin (LIPITOR) 40 MG tablet Take 40 mg by mouth.  . naproxen (NAPROSYN) 500 MG tablet Take 1 tablet (500 mg total) by mouth 2 (two) times daily with a meal.  . [  DISCONTINUED] calcium-vitamin D (OSCAL) 250-125 MG-UNIT per tablet Take 1 tablet by mouth daily.  . [DISCONTINUED] fluconazole (DIFLUCAN) 150 MG tablet May repeat in 3 days  . [EXPIRED] albuterol (PROVENTIL) (2.5 MG/3ML) 0.083% nebulizer solution 2.5 mg    No facility-administered encounter medications on file as of 09/07/2015.          Objective:   Physical Exam  Constitutional: She is oriented to person, place, and time. She appears well-developed and well-nourished.  HENT:  Head: Normocephalic and atraumatic.  Neck: Neck supple. No thyromegaly present.  Cardiovascular: Normal rate, regular rhythm and normal heart sounds.   Pulmonary/Chest: Effort normal and breath sounds normal. No respiratory distress. She has no wheezes. She has no rales. She exhibits tenderness.  Chest tenderness at the lower half of the sternum and tendenress where the ribs meet.    Abdominal: Soft. Bowel sounds are normal. She exhibits no distension and no mass. There is no tenderness. There is no  rebound and no guarding.  Musculoskeletal: She exhibits no edema.  Nontender over her thoracic spine. Nontender over the scapula.  Lymphadenopathy:    She has no cervical adenopathy.  Neurological: She is alert and oriented to person, place, and time.  Skin: Skin is warm and dry.  Psychiatric: She has a normal mood and affect. Her behavior is normal.          Assessment & Plan:  Atypical chest pain-she does only has some chest wall tenderness at the base of the sternum along the sides. Most consistent with costochondritis. Even she does have a history of GERD and would like to try to put her on an NSAID for the next 7-10 days to see if it improves her pain and discomfort. Consider GERD that this is a little bit atypical.  Shortness of breath-unclear if this is really related to the chest pain or not. She admits that choice of breath has really been going on longer than the chest pain. Sounds like she's had a fairly good cardiac checkup in the last few weeks. Will schedule for spirometry in 1-2 week. Peak flows in the yellow zone based on her height and age.  We did give an albuterol neb just to see if she felt any improvement. Repeat Peak flow unchanged.  She is a prior smoker.   CXR done in ED was normal. Consider repeating if not improving but hre exam is normal today.    Due for bone density. Will schedule.

## 2015-09-15 ENCOUNTER — Encounter: Payer: Self-pay | Admitting: Family Medicine

## 2015-09-15 ENCOUNTER — Ambulatory Visit (INDEPENDENT_AMBULATORY_CARE_PROVIDER_SITE_OTHER): Payer: Medicare Other | Admitting: Family Medicine

## 2015-09-15 VITALS — BP 149/52 | HR 51 | Temp 98.4°F | Resp 18 | Wt 152.3 lb

## 2015-09-15 DIAGNOSIS — R0789 Other chest pain: Secondary | ICD-10-CM

## 2015-09-15 DIAGNOSIS — Z1159 Encounter for screening for other viral diseases: Secondary | ICD-10-CM | POA: Diagnosis not present

## 2015-09-15 DIAGNOSIS — J984 Other disorders of lung: Secondary | ICD-10-CM | POA: Diagnosis not present

## 2015-09-15 DIAGNOSIS — Z1211 Encounter for screening for malignant neoplasm of colon: Secondary | ICD-10-CM

## 2015-09-15 DIAGNOSIS — Z1239 Encounter for other screening for malignant neoplasm of breast: Secondary | ICD-10-CM

## 2015-09-15 DIAGNOSIS — R030 Elevated blood-pressure reading, without diagnosis of hypertension: Secondary | ICD-10-CM

## 2015-09-15 MED ORDER — OMEPRAZOLE 40 MG PO CPDR
40.0000 mg | DELAYED_RELEASE_CAPSULE | Freq: Every day | ORAL | Status: DC
Start: 2015-09-15 — End: 2015-12-06

## 2015-09-15 MED ORDER — ALBUTEROL SULFATE (2.5 MG/3ML) 0.083% IN NEBU
2.5000 mg | INHALATION_SOLUTION | Freq: Once | RESPIRATORY_TRACT | Status: AC
  Administered 2015-09-15: 2.5 mg via RESPIRATORY_TRACT

## 2015-09-15 NOTE — Progress Notes (Signed)
   Subjective:    Patient ID: Morgan Butler, female    DOB: Feb 19, 1951, 64 y.o.   MRN: [id number]  HPI Here for spirometry.  Came in a week ago with atypical chest pain and SOB.  At that time I felt like this was probably 2 separate issues as the shortness of breath that started before the chest pain over the sternum. At the time I felt like her symptoms were consistent with costochondritis and then had her come in for spirometry today. She had a prior diagnosis of COPD that was on her problem list but didn't remember having been told she had any pulmonary disease. She did have a chest CT in the system from about 6 years ago in 2010 that showed some mild centrilobular emphysema. She has never been a smoker and denies any known exposures to environmental factors that would have triggered any symptoms for her personally. She has never received chemotherapy or radiation when she had esophageal cancer and had surgery about 18 years ago. She reports mostly shortness of breath with activity. She did try the naproxen for the chest wall pain and it did not provide any relief and was starting to make her nauseated so she stopped it.   Review of Systems     Objective:   Physical Exam  Constitutional: She is oriented to person, place, and time. She appears well-developed and well-nourished.  HENT:  Head: Normocephalic and atraumatic.  Eyes: Conjunctivae and EOM are normal.  Cardiovascular: Normal rate.   Pulmonary/Chest: Effort normal.  Neurological: She is alert and oriented to person, place, and time.  Skin: Skin is dry. No pallor.  Psychiatric: She has a normal mood and affect. Her behavior is normal.  Vitals reviewed.         Assessment & Plan:  Shortness of breath-with prior CT showing centrilobular emphysema. Spirometry today shows FVC of 73%, FEV1 of 69% and ratio of 74%. She did feel he has some restrictive disease and most likely some early obstructive disease as well. I would like to  refer to pulmonology for further evaluation. Again she has never been a smoker.   Atypical chest pain-she is Re: Had a negative cardiac workup. Naproxen did not provide relief. Recommend a two-week trial of a PPI to rule out GERD/reflux. Prescription sent for omeprazole to the pharmacy. Call back if not helping.

## 2015-09-20 ENCOUNTER — Ambulatory Visit (INDEPENDENT_AMBULATORY_CARE_PROVIDER_SITE_OTHER): Payer: Medicare Other

## 2015-09-20 ENCOUNTER — Encounter: Payer: Self-pay | Admitting: Family Medicine

## 2015-09-20 DIAGNOSIS — M8580 Other specified disorders of bone density and structure, unspecified site: Secondary | ICD-10-CM | POA: Diagnosis not present

## 2015-09-20 DIAGNOSIS — N951 Menopausal and female climacteric states: Secondary | ICD-10-CM

## 2015-09-20 DIAGNOSIS — M8589 Other specified disorders of bone density and structure, multiple sites: Secondary | ICD-10-CM | POA: Diagnosis not present

## 2015-09-21 ENCOUNTER — Institutional Professional Consult (permissible substitution): Payer: Medicare Other | Admitting: Pulmonary Disease

## 2015-09-21 ENCOUNTER — Encounter: Payer: Self-pay | Admitting: Pulmonary Disease

## 2015-09-21 ENCOUNTER — Ambulatory Visit (INDEPENDENT_AMBULATORY_CARE_PROVIDER_SITE_OTHER): Payer: Medicare Other | Admitting: Pulmonary Disease

## 2015-09-21 ENCOUNTER — Encounter: Payer: Self-pay | Admitting: Family Medicine

## 2015-09-21 VITALS — BP 126/78 | HR 53 | Ht 61.0 in | Wt 151.8 lb

## 2015-09-21 DIAGNOSIS — J984 Other disorders of lung: Secondary | ICD-10-CM

## 2015-09-21 MED ORDER — TIOTROPIUM BROMIDE MONOHYDRATE 18 MCG IN CAPS
18.0000 ug | ORAL_CAPSULE | Freq: Every day | RESPIRATORY_TRACT | Status: DC
Start: 2015-09-21 — End: 2015-11-29

## 2015-09-21 NOTE — Patient Instructions (Signed)
We will schedule you for full lung function tests and a CT scan of the chest. Start Spiriva inhaler.  Return to clinic in 1-2 months after these tests for further evaluation and to assess response to therapy.

## 2015-09-21 NOTE — Progress Notes (Signed)
Subjective:    Patient ID: Morgan Butler, female    DOB: 15-Jun-1951, 64 y.o.   MRN: [id number]  HPI  Consult for evaluation of restrictive lung disease.  Morgan Butler this 64 year old with past medical history of coronary artery disease, esophageal cancer. She was referred by Morgan Butler for abnormal spirometry suggestive of restrictive lung disease. The main symptoms today are chest pressure and back pain. There are no clear exacerbating or relieving factors. She had a complete negative cardiac workup as noted below. She also has dyspnea on exertion. She cannot walk more than a quarter of a mile before she gets short of breath. She denies any cough, sputum production, wheezing, fevers, chills, hemoptysis. She has extensive smoking history. She smoked 1 pack per day for 30 years. She quit in 1995. She was given an inhaler about 6 years ago. She does not recall the name of this inhaler or if it helped with her symptoms.  She has H/O CAD status post angioplasty with stent of mid LAD in 2014. Prior history of esophageal cancer in 64. This was treated with surgery, resection of a gastric pull-through. She did not get chemotherapy or radiation. She had a stress test in 2015 which suggested in the mid distal anterior ischemia of breast attenuation. She was evaluated in ED on 08/18/15 for chest pain. CXR, EKG, BNP, troponins and d-dimer were normal. Echocardiogram at that time showed EF 60-65 percent, moderately greatly dilated LA and mildly dilated RA. She subsequently followed with Morgan Butler, cardiology. She had a cardiac stress test done on 08/22/15 which is reportedly normal. She also has history of GERD and is on Protonix.   DATA: Spirometry [09/15/15] FVC 2.03 [73%] FEV1 1.49 [69%] F/F 74. FEF 25-75 percent 1.11 [53%] No bronchodilator response.  Chest x-ray [08/18/15] No acute cardiac pulmonary disease. Postsurgical changes of gastric pull-through.  CT scan [05/05/09] Lung windows  demonstrate mild centrilobular emphysema. Stable foci of pleural irregularity adjacent the right upper lobe on image 20. Minimal subpleural reticulation more inferiorly in the anterior right upper lobe and right middle lobe.  Patient Active Problem List   Diagnosis Date Noted  . Restrictive lung disease 09/15/2015  . LYMPHADENOPATHY 05/04/2009  . SHOULDER PAIN, LEFT 03/01/2009  . ELEVATED BLOOD PRESSURE WITHOUT DIAGNOSIS OF HYPERTENSION 01/11/2009  . COPD exacerbation ([place]) 04/27/2008  . CONJUNCTIVITIS, ALLERGIC 02/24/2008  . PALPITATIONS 02/24/2008  . BACKACHE NOS 08/27/2007     Current outpatient prescriptions:  .  acetaminophen (TYLENOL) 650 MG CR tablet, Per bottle as needed, Disp: , Rfl:  .  aspirin 81 MG chewable tablet, Chew 81 mg by mouth., Disp: , Rfl:  .  atorvastatin (LIPITOR) 40 MG tablet, Take 1 tablet (40 mg total) by mouth at bedtime., Disp: 90 tablet, Rfl: 3 .  cholecalciferol (VITAMIN D) 1000 UNITS tablet, Take 2,000 Units by mouth daily., Disp: , Rfl:  .  omeprazole (PRILOSEC) 40 MG capsule, Take 1 capsule (40 mg total) by mouth daily., Disp: 30 capsule, Rfl: 1 .  tiotropium (SPIRIVA) 18 MCG inhalation capsule, Place 1 capsule (18 mcg total) into inhaler and inhale daily., Disp: 30 capsule, Rfl: 6  Review of Systems Chest pressure, back pain. Nonradiating nature. No other exacerbating or relieving factors. Dyspnea on exertion. She denies cough, fevers, chills, sputum production, hemoptysis. Denies any malaise, fatigue, loss of weight, loss of appetite. Denies any nausea, vomiting, diarrhea, constipation. All other review of systems negative.    Objective:   Physical Exam Blood pressure 126/78,  pulse 53, height 5\' 1"  (1.549 m), weight 151 lb 12.8 oz (68.856 kg), SpO2 100 %. Gen: No apparent distress, awake, oriented Neuro: No gross focal deficits. Cranial nerves intact Neck: No JVD, lymphadenopathy, thyromegaly. RS: Clear, no wheeze, crackles CVS: S1-S2  heard, no murmurs rubs gallops. Abdomen: Soft, positive bowel sounds. Extremities: No edema.    Assessment & Plan:  #1 Abnormal PFTs, restriction. Her spirometry shows reduced FEV1 and FVC suggestive of mild restrictive lung disease. She would need a full set of PFTs with lung volumes to make an accurate diagnosis and get an idea of the severity. Review of her imaging including a CT scan in 2010 and a chest x-ray in in 08/18/15 does not show any parenchymal abnormality that could cause an restriction. I suspect the restriction is secondary to her history of esophageal resection and gastric pull-through. Her stomach is primarily in her thorax which could explain the restriction. I will repeat a CT of the chest to make sure there is no new lung process going on.  #2 Smoking history, emphysema She has an extensive smoking history and signs of emphysema on the CT scan although her spirometry shows a normal F/F ratio. However mid flow rates are suggestive of early airway obstruction. She does have dyspnea on exertion and may benefit from trial of Spiriva.  Plan: - Full PFTs with lung volumes. - CT of the chest without contrast. - Start Spiriva.  Return to clinic in 1-2 months.  Morgan Garfinkel MD  Pulmonary and Critical Care Pager [phone] If no answer or after 3pm call: [phone] 09/21/2015, 11:49 AM

## 2015-09-25 ENCOUNTER — Ambulatory Visit (INDEPENDENT_AMBULATORY_CARE_PROVIDER_SITE_OTHER): Payer: Medicare Other

## 2015-09-25 DIAGNOSIS — J984 Other disorders of lung: Secondary | ICD-10-CM

## 2015-09-25 DIAGNOSIS — Z9049 Acquired absence of other specified parts of digestive tract: Secondary | ICD-10-CM | POA: Diagnosis not present

## 2015-09-25 DIAGNOSIS — R0602 Shortness of breath: Secondary | ICD-10-CM | POA: Diagnosis not present

## 2015-09-25 DIAGNOSIS — Z8501 Personal history of malignant neoplasm of esophagus: Secondary | ICD-10-CM

## 2015-10-16 ENCOUNTER — Ambulatory Visit (INDEPENDENT_AMBULATORY_CARE_PROVIDER_SITE_OTHER): Payer: Medicare Other | Admitting: Family Medicine

## 2015-10-16 ENCOUNTER — Encounter: Payer: Self-pay | Admitting: Family Medicine

## 2015-10-16 VITALS — BP 143/69 | HR 57 | Temp 98.5°F | Resp 16 | Wt 152.2 lb

## 2015-10-16 DIAGNOSIS — J0100 Acute maxillary sinusitis, unspecified: Secondary | ICD-10-CM

## 2015-10-16 NOTE — Progress Notes (Signed)
   Subjective:    Patient ID: Morgan Butler, female    DOB: 15-Jul-1951, 64 y.o.   MRN: [id number]  HPI 4 days of sinus congestion. No fever. + sweats.  Taking Tylenol, Alkeseltzer sinus and cold.   Left ear pain, pressure. No drainage.  No cough or chest congestion.  Mild St. Bilateral facial cheek pain.     Review of Systems     Objective:   Physical Exam  Constitutional: She is oriented to person, place, and time. She appears well-developed and well-nourished.  HENT:  Head: Normocephalic and atraumatic.  Right Ear: External ear normal.  Left Ear: External ear normal.  Nose: Nose normal.  Mouth/Throat: Oropharynx is clear and moist.  TMs and canals are clear.   Eyes: Conjunctivae and EOM are normal. Pupils are equal, round, and reactive to light.  Neck: Neck supple. No thyromegaly present.  Cardiovascular: Normal rate, regular rhythm and normal heart sounds.   Pulmonary/Chest: Effort normal and breath sounds normal. She has no wheezes.  Lymphadenopathy:    She has no cervical adenopathy.  Neurological: She is alert and oriented to person, place, and time.  Skin: Skin is warm and dry.  Psychiatric: She has a normal mood and affect.          Assessment & Plan:  Acute maxillary sinusitis-recommend symptom  care. Not feeling somewhat better by Thursday of thi week then she can give the office a call back and we'll consider calling in an antibiotic at that point. Call sooner if feeling worse.

## 2015-10-16 NOTE — Patient Instructions (Signed)

## 2015-10-20 DIAGNOSIS — H25813 Combined forms of age-related cataract, bilateral: Secondary | ICD-10-CM | POA: Diagnosis not present

## 2015-10-20 DIAGNOSIS — H35433 Paving stone degeneration of retina, bilateral: Secondary | ICD-10-CM | POA: Diagnosis not present

## 2015-11-03 ENCOUNTER — Encounter: Payer: Self-pay | Admitting: Pulmonary Disease

## 2015-11-03 ENCOUNTER — Ambulatory Visit (INDEPENDENT_AMBULATORY_CARE_PROVIDER_SITE_OTHER): Payer: Medicare Other | Admitting: Pulmonary Disease

## 2015-11-03 VITALS — BP 132/74 | HR 67 | Ht 62.0 in | Wt 153.0 lb

## 2015-11-03 DIAGNOSIS — J984 Other disorders of lung: Secondary | ICD-10-CM

## 2015-11-03 LAB — PULMONARY FUNCTION TEST
DL/VA % PRED: 121 %
DL/VA: 5.51 ml/min/mmHg/L
DLCO unc % pred: 81 %
DLCO unc: 17.52 ml/min/mmHg
FEF 25-75 POST: 1.46 L/s
FEF 25-75 Pre: 1.02 L/sec
FEF2575-%CHANGE-POST: 43 %
FEF2575-%PRED-PRE: 52 %
FEF2575-%Pred-Post: 74 %
FEV1-%Change-Post: 8 %
FEV1-%PRED-PRE: 60 %
FEV1-%Pred-Post: 65 %
FEV1-PRE: 1.33 L
FEV1-Post: 1.45 L
FEV1FVC-%CHANGE-POST: 5 %
FEV1FVC-%Pred-Pre: 99 %
FEV6-%CHANGE-POST: 2 %
FEV6-%PRED-PRE: 63 %
FEV6-%Pred-Post: 64 %
FEV6-POST: 1.79 L
FEV6-PRE: 1.74 L
FEV6FVC-%PRED-PRE: 104 %
FEV6FVC-%Pred-Post: 104 %
FVC-%CHANGE-POST: 2 %
FVC-%PRED-PRE: 60 %
FVC-%Pred-Post: 61 %
FVC-POST: 1.79 L
FVC-Pre: 1.74 L
POST FEV1/FVC RATIO: 81 %
Post FEV6/FVC ratio: 100 %
Pre FEV1/FVC ratio: 76 %
Pre FEV6/FVC Ratio: 100 %
RV % pred: 99 %
RV: 2 L
TLC % pred: 86 %
TLC: 4.11 L

## 2015-11-03 NOTE — Progress Notes (Signed)
PFT done today. 

## 2015-11-03 NOTE — Progress Notes (Signed)
Subjective:    Patient ID: Morgan Butler, female    DOB: [birth year], 64 y.o.   MRN: [id number]  HPI   Consult for evaluation of restrictive lung disease.  Mrs. Angelopoulos this 64 year old with past medical history of coronary artery disease, esophageal cancer. She was referred by Dr. Madilyn Fireman for abnormal spirometry suggestive of restrictive lung disease. The main symptoms today are chest pressure and back pain. There are no clear exacerbating or relieving factors. She had a complete negative cardiac workup as noted below. She also has dyspnea on exertion. She cannot walk more than a quarter of a mile before she gets short of breath. She denies any cough, sputum production, wheezing, fevers, chills, hemoptysis. She has extensive smoking history. She smoked 1 pack per day for 30 years. She quit in 1995. She was given an inhaler about 6 years ago. She does not recall the name of this inhaler or if it helped with her symptoms.  She has H/O CAD status post angioplasty with stent of mid LAD in 2014. Prior history of esophageal cancer in 46. This was treated with surgery, resection of a gastric pull-through. She did not get chemotherapy or radiation. She had a stress test in 2015 which suggested in the mid distal anterior ischemia of breast attenuation. She was evaluated in ED on 08/18/15 for chest pain. CXR, EKG, BNP, troponins and d-dimer were normal. Echocardiogram at that time showed EF 60-65 percent, moderately greatly dilated LA and mildly dilated RA. She subsequently followed with Dr. Mauricio Po, cardiology. She had a cardiac stress test done on 08/22/15 which is reportedly normal. She also has history of GERD and is on Protonix.   DATA: Spirometry [09/15/15] FVC 2.03 [73%] FEV1 1.49 [69%] F/F 74. FEF 25-75 percent 1.11 [53%] No bronchodilator response.  PFTs [11/03/15) FVC 1.74 [60%] FEV1 1.33 [60%] F/F 76 SVC 2.11 [73%] TLC 4.11 [86%] DLCO 81%.  Chest x-ray [08/18/15] No acute cardiac  pulmonary disease. Postsurgical changes of gastric pull-through.  CT scan [05/05/09] Lung windows demonstrate mild centrilobular emphysema. Stable foci of pleural irregularity adjacent the right upper lobe on image 20. Minimal subpleural reticulation more inferiorly in the anterior right upper lobe and right middle lobe.  CT scan [09/25/15] Status post partial esophagectomy with gastric pull-through. No evidence of recurrent or metastatic disease in the chest. No evidence of acute cardiopulmonary disease.  Patient Active Problem List   Diagnosis Date Noted  . Restrictive lung disease 09/15/2015  . LYMPHADENOPATHY 05/04/2009  . SHOULDER PAIN, LEFT 03/01/2009  . ELEVATED BLOOD PRESSURE WITHOUT DIAGNOSIS OF HYPERTENSION 01/11/2009  . COPD exacerbation ([place]) 04/27/2008  . CONJUNCTIVITIS, ALLERGIC 02/24/2008  . PALPITATIONS 02/24/2008  . BACKACHE NOS 08/27/2007     Current outpatient prescriptions:  .  acetaminophen (TYLENOL) 650 MG CR tablet, Per bottle as needed, Disp: , Rfl:  .  aspirin 81 MG chewable tablet, Chew 81 mg by mouth., Disp: , Rfl:  .  atorvastatin (LIPITOR) 40 MG tablet, Take 1 tablet (40 mg total) by mouth at bedtime., Disp: 90 tablet, Rfl: 3 .  Calcium Carbonate-Vitamin D (OYSTER SHELL CALCIUM/D) 250-125 MG-UNIT TABS, Take 1 tablet by mouth daily., Disp: , Rfl:  .  cholecalciferol (VITAMIN D) 1000 UNITS tablet, Take 2,000 Units by mouth daily., Disp: , Rfl:  .  omeprazole (PRILOSEC) 40 MG capsule, Take 1 capsule (40 mg total) by mouth daily., Disp: 30 capsule, Rfl: 1 .  tiotropium (SPIRIVA) 18 MCG inhalation capsule, Place 1 capsule (18 mcg total) into inhaler  and inhale daily. (Patient not taking: Reported on 11/03/2015), Disp: 30 capsule, Rfl: 6  Review of Systems Chest pressure, back pain. Nonradiating nature. No other exacerbating or relieving factors. Dyspnea on exertion. She denies cough, fevers, chills, sputum production, hemoptysis. Denies any malaise,  fatigue, loss of weight, loss of appetite. Denies any nausea, vomiting, diarrhea, constipation. All other review of systems negative.    Objective:   Physical Exam  Blood pressure 132/74, pulse 67, height 5\' 2"  (1.575 m), weight 153 lb (69.4 kg), SpO2 97 %. Gen: No apparent distress, awake, oriented Neuro: No gross focal deficits. Cranial nerves intact Neck: No JVD, lymphadenopathy, thyromegaly. RS: Clear, no wheeze, crackles CVS: S1-S2 heard, no murmurs rubs gallops. Abdomen: Soft, positive bowel sounds. Extremities: No edema.    Assessment & Plan:  #1 Abnormal PFTs. Although the spirometry shows reduced FEV1 and FVC suggestive of mild restrictive lung disease. We did a full set of PFTs in the office today. There is no obvious restriction there is her total lung capacity there. Her total lung capacity by plethysmography is normal. CT scan does not show any obvious parenchymal abnormality, infiltrate.  #2 Smoking history, emphysema She has an extensive smoking history and signs of emphysema on the CT scan although her spirometry shows a normal F/F ratio. However mid flow rates are suggestive of early airway obstruction. She did try Spiriva but stopped because it was causing her dry mouth. She is not really symptomatic from a respiratory standpoint. We can observe her off inhalers.  She will be discharged from the pulmonary clinic as her PFTs and CT scan are not very abnormal and she is asymptomatic. She can be referred back if any new symptoms or questions arise.  Marshell Garfinkel MD [facility] Pager [phone] If no answer or after 3pm call: [phone] 11/03/2015, 1:48 PM

## 2015-11-03 NOTE — Patient Instructions (Signed)
He need not have a return appointment with the pulmonary clinic. Please return to clinic if any new symptoms or questions arise.

## 2015-11-14 ENCOUNTER — Ambulatory Visit (INDEPENDENT_AMBULATORY_CARE_PROVIDER_SITE_OTHER): Payer: Medicare Other | Admitting: Family Medicine

## 2015-11-14 ENCOUNTER — Encounter: Payer: Self-pay | Admitting: Family Medicine

## 2015-11-14 VITALS — BP 151/74 | HR 71 | Temp 98.3°F | Wt 153.0 lb

## 2015-11-14 DIAGNOSIS — J0100 Acute maxillary sinusitis, unspecified: Secondary | ICD-10-CM | POA: Diagnosis not present

## 2015-11-14 MED ORDER — AMOXICILLIN-POT CLAVULANATE 875-125 MG PO TABS: 1.0000 | ORAL_TABLET | Freq: Two times a day (BID) | ORAL | Status: DC

## 2015-11-14 MED ORDER — HYDROCODONE-HOMATROPINE 5-1.5 MG/5ML PO SYRP: 5.0000 mL | ORAL_SOLUTION | Freq: Every evening | ORAL | Status: DC | PRN

## 2015-11-14 NOTE — Progress Notes (Addendum)
   Subjective:    Patient ID: Morgan Butler, female    DOB: 1951-07-11, 64 y.o.   MRN: [id number]  HPI 64 year old female comes in today with cough 1 month. By About a Month Ago for Sinus Type Symptoms. She Started a Little Bit Better As Far As the Sinuses Were Concerned but Now 2 Weeks Ago Started to Get Significant Congestion and Facial Pain and Pressure Again. She Still Continued to Cough and Has A Lot Of Postnasal Drip. No Shortness of Breath or Palpitations. She Is Now Getting Some Pressure behind the Right Posterior Rib Area. She has been taking Alka-Seltzer cough and cold medication.   Review of Systems     Objective:   Physical Exam  Constitutional: She is oriented to person, place, and time. She appears well-developed and well-nourished.  HENT:  Head: Normocephalic and atraumatic.  Right Ear: External ear normal.  Left Ear: External ear normal.  Nose: Nose normal.  Mouth/Throat: Oropharynx is clear and moist.  TMs and canals are clear. Tender over both maxillary sinuses  Eyes: Conjunctivae and EOM are normal. Pupils are equal, round, and reactive to light.  Neck: Neck supple. No thyromegaly present.  Cardiovascular: Normal rate, regular rhythm and normal heart sounds.   Pulmonary/Chest: Effort normal and breath sounds normal. She has no wheezes.  Lymphadenopathy:    She has no cervical adenopathy.  Neurological: She is alert and oriented to person, place, and time.  Skin: Skin is warm and dry.  Psychiatric: She has a normal mood and affect.          Assessment & Plan:  Acute sinusitis - will treat with Augmentin. Call if not better in 5-6 days. Given prescription for hydrocodone cough syrup. Follow-up in 2 weeks for blood pressure check since her pressure was a little bit elevated today.

## 2015-11-17 ENCOUNTER — Telehealth: Payer: Self-pay

## 2015-11-17 MED ORDER — LEVOFLOXACIN 500 MG PO TABS: 500.0000 mg | ORAL_TABLET | Freq: Every day | ORAL | Status: DC

## 2015-11-17 NOTE — Telephone Encounter (Signed)
Patient states she is not any better. She wants a stronger antibiotic. Levaquin 500 mg 1 daily for 7 days, per Dr Madilyn Fireman. Medication sent and patient is aware.

## 2015-11-27 ENCOUNTER — Ambulatory Visit: Payer: Medicare Other | Admitting: Family Medicine

## 2015-11-29 ENCOUNTER — Encounter: Payer: Self-pay | Admitting: Family Medicine

## 2015-11-29 ENCOUNTER — Ambulatory Visit (INDEPENDENT_AMBULATORY_CARE_PROVIDER_SITE_OTHER): Payer: Medicare Other | Admitting: Family Medicine

## 2015-11-29 ENCOUNTER — Ambulatory Visit (INDEPENDENT_AMBULATORY_CARE_PROVIDER_SITE_OTHER): Payer: Medicare Other

## 2015-11-29 VITALS — BP 142/80 | HR 59 | Wt 156.0 lb

## 2015-11-29 DIAGNOSIS — R030 Elevated blood-pressure reading, without diagnosis of hypertension: Secondary | ICD-10-CM | POA: Diagnosis not present

## 2015-11-29 DIAGNOSIS — Z1231 Encounter for screening mammogram for malignant neoplasm of breast: Secondary | ICD-10-CM

## 2015-11-29 NOTE — Progress Notes (Signed)
   Subjective:    Patient ID: Morgan Butler, female    DOB: 28-Jul-1951, 65 y.o.   MRN: [id number]  HPI Here to follow-up on elevated blood pressure. When I saw her last time she was here for a sick visit her blood pressure was quite high. I had asked her to come back in a couple weeks when she was feeling better which she is. She has not been monitoring her blood pressures at home. She has made some changes to try reduce her pressure. She has not taken any NSAIDs today. She did take some Tylenol this morning.  Review of Systems     Objective:   Physical Exam  Constitutional: She is oriented to person, place, and time. She appears well-developed and well-nourished.  HENT:  Head: Normocephalic and atraumatic.  Cardiovascular: Normal rate, regular rhythm and normal heart sounds.   Pulmonary/Chest: Effort normal and breath sounds normal.  Neurological: She is alert and oriented to person, place, and time.  Skin: Skin is warm and dry.  Psychiatric: She has a normal mood and affect. Her behavior is normal.          Assessment & Plan:  Elevated BP - discussed diagnosis. Her blood pressure is better today. Did review with her the DASH diet as well given additional handout and information. Return go online to get a full copy of this. I like to see her back in a month. I want to check her blood pressures at home and bring in her log and her home blood pressure machine for comparison. Next  Due for screening mammogram. We'll try get that scheduled today of possible.  She is due for Prevnar 13.

## 2015-11-29 NOTE — Patient Instructions (Signed)
DASH Eating Plan  DASH stands for "Dietary Approaches to Stop Hypertension." The DASH eating plan is a healthy eating plan that has been shown to reduce high blood pressure (hypertension). Additional health benefits may include reducing the risk of type 2 diabetes mellitus, heart disease, and stroke. The DASH eating plan may also help with weight loss.  WHAT DO I NEED TO KNOW ABOUT THE DASH EATING PLAN?  For the DASH eating plan, you will follow these general guidelines:  · Choose foods with a percent daily value for sodium of less than 5% (as listed on the food label).  · Use salt-free seasonings or herbs instead of table salt or sea salt.  · Check with your health care provider or pharmacist before using salt substitutes.  · Eat lower-sodium products, often labeled as "lower sodium" or "no salt added."  · Eat fresh foods.  · Eat more vegetables, fruits, and low-fat dairy products.  · Choose whole grains. Look for the word "whole" as the first word in the ingredient list.  · Choose fish and skinless chicken or turkey more often than red meat. Limit fish, poultry, and meat to 6 oz (170 g) each day.  · Limit sweets, desserts, sugars, and sugary drinks.  · Choose heart-healthy fats.  · Limit cheese to 1 oz (28 g) per day.  · Eat more home-cooked food and less restaurant, buffet, and fast food.  · Limit fried foods.  · Cook foods using methods other than frying.  · Limit canned vegetables. If you do use them, rinse them well to decrease the sodium.  · When eating at a restaurant, ask that your food be prepared with less salt, or no salt if possible.  WHAT FOODS CAN I EAT?  Seek help from a dietitian for individual calorie needs.  Grains  Whole grain or whole wheat bread. Brown rice. Whole grain or whole wheat pasta. Quinoa, bulgur, and whole grain cereals. Low-sodium cereals. Corn or whole wheat flour tortillas. Whole grain cornbread. Whole grain crackers. Low-sodium crackers.  Vegetables  Fresh or frozen vegetables  (raw, steamed, roasted, or grilled). Low-sodium or reduced-sodium tomato and vegetable juices. Low-sodium or reduced-sodium tomato sauce and paste. Low-sodium or reduced-sodium canned vegetables.   Fruits  All fresh, canned (in natural juice), or frozen fruits.  Meat and Other Protein Products  Ground beef (85% or leaner), grass-fed beef, or beef trimmed of fat. Skinless chicken or turkey. Ground chicken or turkey. Pork trimmed of fat. All fish and seafood. Eggs. Dried beans, peas, or lentils. Unsalted nuts and seeds. Unsalted canned beans.  Dairy  Low-fat dairy products, such as skim or 1% milk, 2% or reduced-fat cheeses, low-fat ricotta or cottage cheese, or plain low-fat yogurt. Low-sodium or reduced-sodium cheeses.  Fats and Oils  Tub margarines without trans fats. Light or reduced-fat mayonnaise and salad dressings (reduced sodium). Avocado. Safflower, olive, or canola oils. Natural peanut or almond butter.  Other  Unsalted popcorn and pretzels.  The items listed above may not be a complete list of recommended foods or beverages. Contact your dietitian for more options.  WHAT FOODS ARE NOT RECOMMENDED?  Grains  White bread. White pasta. White rice. Refined cornbread. Bagels and croissants. Crackers that contain trans fat.  Vegetables  Creamed or fried vegetables. Vegetables in a cheese sauce. Regular canned vegetables. Regular canned tomato sauce and paste. Regular tomato and vegetable juices.  Fruits  Dried fruits. Canned fruit in light or heavy syrup. Fruit juice.  Meat and Other Protein   Products  Fatty cuts of meat. Ribs, chicken wings, bacon, sausage, bologna, salami, chitterlings, fatback, hot dogs, bratwurst, and packaged luncheon meats. Salted nuts and seeds. Canned beans with salt.  Dairy  Whole or 2% milk, cream, half-and-half, and cream cheese. Whole-fat or sweetened yogurt. Full-fat cheeses or blue cheese. Nondairy creamers and whipped toppings. Processed cheese, cheese spreads, or cheese  curds.  Condiments  Onion and garlic salt, seasoned salt, table salt, and sea salt. Canned and packaged gravies. Worcestershire sauce. Tartar sauce. Barbecue sauce. Teriyaki sauce. Soy sauce, including reduced sodium. Steak sauce. Fish sauce. Oyster sauce. Cocktail sauce. Horseradish. Ketchup and mustard. Meat flavorings and tenderizers. Bouillon cubes. Hot sauce. Tabasco sauce. Marinades. Taco seasonings. Relishes.  Fats and Oils  Butter, stick margarine, lard, shortening, ghee, and bacon fat. Coconut, palm kernel, or palm oils. Regular salad dressings.  Other  Pickles and olives. Salted popcorn and pretzels.  The items listed above may not be a complete list of foods and beverages to avoid. Contact your dietitian for more information.  WHERE CAN I FIND MORE INFORMATION?  National Heart, Lung, and [employer] Institute: www.nhlbi.nih.gov/health/health-topics/topics/dash/     This information is not intended to replace advice given to you by your health care provider. Make sure you discuss any questions you have with your health care provider.     Document Released: 10/24/2011 Document Revised: 11/25/2014 Document Reviewed: 09/08/2013  Elsevier Interactive Patient Education ©2016 [employer]

## 2015-12-06 ENCOUNTER — Other Ambulatory Visit: Payer: Self-pay | Admitting: Family Medicine

## 2015-12-27 ENCOUNTER — Ambulatory Visit (INDEPENDENT_AMBULATORY_CARE_PROVIDER_SITE_OTHER): Payer: Medicare Other | Admitting: Family Medicine

## 2015-12-27 ENCOUNTER — Encounter: Payer: Self-pay | Admitting: Family Medicine

## 2015-12-27 VITALS — BP 136/57 | HR 56 | Wt 152.0 lb

## 2015-12-27 DIAGNOSIS — L219 Seborrheic dermatitis, unspecified: Secondary | ICD-10-CM

## 2015-12-27 DIAGNOSIS — IMO0001 Reserved for inherently not codable concepts without codable children: Secondary | ICD-10-CM

## 2015-12-27 DIAGNOSIS — R030 Elevated blood-pressure reading, without diagnosis of hypertension: Secondary | ICD-10-CM

## 2015-12-27 MED ORDER — AMBULATORY NON FORMULARY MEDICATION: Status: DC

## 2015-12-27 NOTE — Progress Notes (Signed)
   Subjective:    Patient ID: Morgan Butler, female    DOB: [birth year], 65 y.o.   MRN: [id number]  HPI Here to re-eval for elevated BP.  She has been trying the DASH diet and she has lost 4 lbs in the last month. She is doing fantastic. She did bring in her home blood pressure cuff today. Her blood pressures at home have been running in the 130s consistently.  Rash behind her left ear. Been there for several months. She's been trying to use some Selsun Blue on it but just gets very itchy at times. She occasionally puts peroxide on it and that does provide temporary relief.   Review of Systems     Objective:   Physical Exam  Constitutional: She is oriented to person, place, and time. She appears well-developed and well-nourished.  HENT:  Head: Normocephalic and atraumatic.  Cardiovascular: Normal rate, regular rhythm and normal heart sounds.   Pulmonary/Chest: Effort normal and breath sounds normal.  Neurological: She is alert and oriented to person, place, and time.  Skin: Skin is warm and dry.  Dry scaling rash behind the left ear with some excoriations.   Psychiatric: She has a normal mood and affect. Her behavior is normal.          Assessment & Plan:  Elevated BP - home blood pressures well controlled and she did bring her home cuff today. If anything it is running a little higher than our blood pressure machine. continue to monitor carefully. I also think the 4 pound weight loss and eating more healthy has helped as well. Continue with low-salt diet.   Seborrheic dermatitis-recommend a trial of over-the-counter hydrocortisone on the area. If no significant improvement after about 5 days and please let me know and can send over a stronger topical steroid prescription. Okay to continue the [facility] as well.

## 2015-12-27 NOTE — Patient Instructions (Signed)
Try hydrocortisone twcie a day. If not helping after 5 days then call and we can call in something stronger.

## 2015-12-28 DIAGNOSIS — R0789 Other chest pain: Secondary | ICD-10-CM | POA: Diagnosis not present

## 2015-12-28 DIAGNOSIS — R030 Elevated blood-pressure reading, without diagnosis of hypertension: Secondary | ICD-10-CM | POA: Diagnosis not present

## 2015-12-28 DIAGNOSIS — Z1159 Encounter for screening for other viral diseases: Secondary | ICD-10-CM | POA: Diagnosis not present

## 2015-12-29 LAB — LIPID PANEL
Cholesterol: 141 mg/dL (ref 125–200)
HDL: 59 mg/dL (ref >=46)
LDL CALC: 61 mg/dL (ref <130)
TRIGLYCERIDES: 106 mg/dL (ref <150)
Total CHOL/HDL Ratio: 2.4 Ratio (ref <=5.0)
VLDL: 21 mg/dL (ref <30)

## 2015-12-29 LAB — COMPLETE METABOLIC PANEL WITH GFR
ALK PHOS: 104 U/L (ref 33–130)
ALT: 13 U/L (ref 6–29)
AST: 14 U/L (ref 10–35)
Albumin: 3.8 g/dL (ref 3.6–5.1)
BUN: 14 mg/dL (ref 7–25)
CO2: 28 mmol/L (ref 20–31)
CREATININE: 0.65 mg/dL (ref 0.50–0.99)
Calcium: 9.4 mg/dL (ref 8.6–10.4)
Chloride: 105 mmol/L (ref 98–110)
GFR, Est African American: >89 mL/min (ref >=60)
GFR, Est Non African American: >89 mL/min (ref >=60)
GLUCOSE: 102 mg/dL — AB (ref 65–99)
Potassium: 4.2 mmol/L (ref 3.5–5.3)
SODIUM: 141 mmol/L (ref 135–146)
TOTAL PROTEIN: 6.8 g/dL (ref 6.1–8.1)
Total Bilirubin: 0.7 mg/dL (ref 0.2–1.2)

## 2015-12-29 LAB — HEPATITIS C ANTIBODY: HCV AB: NEGATIVE

## 2016-03-06 ENCOUNTER — Ambulatory Visit (INDEPENDENT_AMBULATORY_CARE_PROVIDER_SITE_OTHER): Payer: Medicare Other | Admitting: Family Medicine

## 2016-03-06 ENCOUNTER — Encounter: Payer: Self-pay | Admitting: Family Medicine

## 2016-03-06 ENCOUNTER — Ambulatory Visit (INDEPENDENT_AMBULATORY_CARE_PROVIDER_SITE_OTHER): Payer: Medicare Other

## 2016-03-06 ENCOUNTER — Telehealth: Payer: Self-pay

## 2016-03-06 VITALS — BP 158/78 | HR 59 | Wt 154.0 lb

## 2016-03-06 DIAGNOSIS — M545 Low back pain, unspecified: Secondary | ICD-10-CM

## 2016-03-06 DIAGNOSIS — M5136 Other intervertebral disc degeneration, lumbar region: Secondary | ICD-10-CM | POA: Diagnosis not present

## 2016-03-06 MED ORDER — NAPROXEN 500 MG PO TABS: 500.0000 mg | ORAL_TABLET | Freq: Two times a day (BID) | ORAL | Status: AC

## 2016-03-06 MED ORDER — TRAMADOL HCL 50 MG PO TABS: 50.0000 mg | ORAL_TABLET | Freq: Three times a day (TID) | ORAL | Status: DC | PRN

## 2016-03-06 NOTE — Assessment & Plan Note (Addendum)
Lumbar pain with mild tenderness to palpation along the midline and mild hip flexion and knee extension weakness. This is probably a lumbosacral strain however some of her symptoms may be consistent with a lumbar compression fracture. Plan for x-ray today followed by MRI on Saturday. We carefully reviewed warning signs and symptoms including worsening weakness or bowel bladder dysfunction. Patient displays a symptoms she is to call immediately and we will obtain a stat lumbar MRI.  Plan to treat pain with NSAIDs and physical therapy heating pad and a TENS unit.  Additionally we will use tramadol sparingly with the understanding that this may cause dizziness and confusion. Patient understands the risks of this medicine.

## 2016-03-06 NOTE — Telephone Encounter (Signed)
Morgan Butler I think you've chosen the wrong patient I've never seen this patient and she's never been on phentermine. Please see if you meant another patient.

## 2016-03-06 NOTE — Progress Notes (Signed)
Morgan Butler is a 65 y.o. female who presents to [place] today for back pain. Patient notes a 5 day history of midline low back pain. She felt immediate onset of pain when she was picking up her 22 pound grandchild. She's had continued pain since. She denies any bowel bladder dysfunction or radiating pain beyond the lateral hips. She does note weakness especially with flexion and knee extension bilaterally. Weakness is mild. She denies any fevers or chills vomiting or diarrhea. She has tried Tylenol which does help pain some.   Past Medical History  Diagnosis Date  . Cancer (HCC)     esophageal  . Osteoporosis   . History of intravascular stent placement   . Hypertension    Past Surgical History  Procedure Laterality Date  . Appendectomy  1966  . Abdominal hysterectomy  1995  . Tonsillectomy    . Left shoulder    . Nasal sinus surgery  2009  . Tumor excision    . Coronary stent placement  2014   Social History  Substance Use Topics  . Smoking status: Former Smoker -- 1.00 packs/day for 29 years    Types: Cigarettes    Quit date: 11/18/1993  . Smokeless tobacco: Not on file  . Alcohol Use: No     Comment: rare   family history includes Cancer in her maternal aunt; Heart disease in her maternal aunt; Kidney failure in her mother.  ROS:  No headache, visual changes, nausea, vomiting, diarrhea, constipation, dizziness, abdominal pain, skin rash, fevers, chills, night sweats, weight loss, swollen lymph nodes, body aches, joint swelling, muscle aches, chest pain, shortness of breath, mood changes, visual or auditory hallucinations.    Medications: Current Outpatient Prescriptions  Medication Sig Dispense Refill  . acetaminophen (TYLENOL) 650 MG CR tablet Per bottle as needed    . AMBULATORY NON FORMULARY MEDICATION Medication Name: Tdap  IM x 1 1 vial 0  . aspirin 81 MG chewable tablet Chew 81 mg by mouth.    Marland Kitchen atorvastatin  (LIPITOR) 40 MG tablet Take 1 tablet (40 mg total) by mouth at bedtime. 90 tablet 3  . Calcium Carbonate-Vitamin D (OYSTER SHELL CALCIUM/D) 250-125 MG-UNIT TABS Take 1 tablet by mouth daily.    . cholecalciferol (VITAMIN D) 1000 UNITS tablet Take 2,000 Units by mouth daily.    . Multiple Vitamins-Minerals (MULTIVITAMIN WITH MINERALS) tablet Take 1 tablet by mouth daily.    Marland Kitchen omeprazole (PRILOSEC) 40 MG capsule TAKE 1 CAPSULE (40 MG TOTAL) BY MOUTH DAILY. 30 capsule 8  . naproxen (NAPROSYN) 500 MG tablet Take 1 tablet (500 mg total) by mouth 2 (two) times daily with a meal. 30 tablet 0  . traMADol (ULTRAM) 50 MG tablet Take 1 tablet (50 mg total) by mouth every 8 (eight) hours as needed. 30 tablet 0   No current facility-administered medications for this visit.   Allergies  Allergen Reactions  . Sulfa Antibiotics Rash     Exam:  BP 158/78 mmHg  Pulse 59  Wt 154 lb (69.854 kg) General: Well Developed, well nourished, and in no acute distress.  Neuro/Psych: Alert and oriented x3, extra-ocular muscles intact, able to move all 4 extremities, sensation grossly intact. Skin: Warm and dry, no rashes noted.  Respiratory: Not using accessory muscles, speaking in full sentences, trachea midline.  Cardiovascular: Pulses palpable, no extremity edema. Abdomen: Does not appear distended. MSK: Back: Tender to palpation lumbar spinal midline. Tender to palpation bilateral lumbar  paraspinal muscles. Decreased back flexion normal extension rotation. Sensation is intact bilateral lower extremities. Mildly weak 4+ to 4/5 bilateral hip flexion and knee extension Normal knee flexion foot plantar and dorsiflexion. Reflexes are equal and normal bilateral knees and ankles. Patient is able to stand from a seated position and stand on her toes and heels and can squat. She walks normally.  X-ray L-spine pending   No results found for this or any previous visit (from the past 24 hour(s)). No results  found.   Please see individual assessment and plan sections.   CC: METHENEY,CATHERINE, MD

## 2016-03-06 NOTE — Telephone Encounter (Signed)
Pt was in today for an office visit with Dr Georgina Snell and requested a refill on phentermine. Today's vitals:   158/78 mmHg 59 154 lb (69.854 kg)  If appropriate please send to the pharmacy on file.

## 2016-03-06 NOTE — Patient Instructions (Addendum)
Thank you for coming in today. Return in 2-4 weeks.  Attend PT.  Get xray and MRI.  Return sooner if needed.  Call or go to the ER if you develop a large red swollen joint with extreme pain or oozing puss.   Lumbosacral Strain Lumbosacral strain is a strain of any of the parts that make up your lumbosacral vertebrae. Your lumbosacral vertebrae are the bones that make up the lower third of your backbone. Your lumbosacral vertebrae are held together by muscles and tough, fibrous tissue (ligaments).  CAUSES  A sudden blow to your back can cause lumbosacral strain. Also, anything that causes an excessive stretch of the muscles in the low back can cause this strain. This is typically seen when people exert themselves strenuously, fall, lift heavy objects, bend, or crouch repeatedly. RISK FACTORS  Physically demanding work.  Participation in pushing or pulling sports or sports that require a sudden twist of the back (tennis, golf, baseball).  Weight lifting.  Excessive lower back curvature.  Forward-tilted pelvis.  Weak back or abdominal muscles or both.  Tight hamstrings. SIGNS AND SYMPTOMS  Lumbosacral strain may cause pain in the area of your injury or pain that moves (radiates) down your leg.  DIAGNOSIS Your health care provider can often diagnose lumbosacral strain through a physical exam. In some cases, you may need tests such as X-ray exams.  TREATMENT  Treatment for your lower back injury depends on many factors that your clinician will have to evaluate. However, most treatment will include the use of anti-inflammatory medicines. HOME CARE INSTRUCTIONS   Avoid hard physical activities (tennis, racquetball, waterskiing) if you are not in proper physical condition for it. This may aggravate or create problems.  If you have a back problem, avoid sports requiring sudden body movements. Swimming and walking are generally safer activities.  Maintain good posture.  Maintain a  healthy weight.  For acute conditions, you may put ice on the injured area.  Put ice in a plastic bag.  Place a towel between your skin and the bag.  Leave the ice on for 20 minutes, 2-3 times a day.  When the low back starts healing, stretching and strengthening exercises may be recommended. SEEK MEDICAL CARE IF:  Your back pain is getting worse.  You experience severe back pain not relieved with medicines. SEEK IMMEDIATE MEDICAL CARE IF:   You have numbness, tingling, weakness, or problems with the use of your arms or legs.  There is a change in bowel or bladder control.  You have increasing pain in any area of the body, including your belly (abdomen).  You notice shortness of breath, dizziness, or feel faint.  You feel sick to your stomach (nauseous), are throwing up (vomiting), or become sweaty.  You notice discoloration of your toes or legs, or your feet get very cold. MAKE SURE YOU:   Understand these instructions.  Will watch your condition.  Will get help right away if you are not doing well or get worse.   This information is not intended to replace advice given to you by your health care provider. Make sure you discuss any questions you have with your health care provider.   Document Released: 08/14/2005 Document Revised: 11/25/2014 Document Reviewed: 06/23/2013 Elsevier Interactive Patient Education [employer].

## 2016-03-07 NOTE — Progress Notes (Signed)
Quick Note:  There is a suspected compression fracture. This will be better seen on MRI. ______

## 2016-03-07 NOTE — Telephone Encounter (Signed)
This information was entered in error. Please disregard.

## 2016-03-07 NOTE — Telephone Encounter (Signed)
Do you remember who the pt was that needed the phentermine refill?

## 2016-03-09 ENCOUNTER — Ambulatory Visit (HOSPITAL_BASED_OUTPATIENT_CLINIC_OR_DEPARTMENT_OTHER)
Admission: RE | Admit: 2016-03-09 | Discharge: 2016-03-09 | Disposition: A | Discharge status: Home / Self Care | Payer: Medicare Other | Source: Ambulatory Visit | Attending: Family Medicine | Admitting: Family Medicine

## 2016-03-09 DIAGNOSIS — M5136 Other intervertebral disc degeneration, lumbar region: Secondary | ICD-10-CM | POA: Insufficient documentation

## 2016-03-09 DIAGNOSIS — M4806 Spinal stenosis, lumbar region: Secondary | ICD-10-CM | POA: Diagnosis not present

## 2016-03-09 DIAGNOSIS — M5126 Other intervertebral disc displacement, lumbar region: Secondary | ICD-10-CM | POA: Diagnosis not present

## 2016-03-09 DIAGNOSIS — M4854XA Collapsed vertebra, not elsewhere classified, thoracic region, initial encounter for fracture: Secondary | ICD-10-CM | POA: Insufficient documentation

## 2016-03-09 DIAGNOSIS — M545 Low back pain, unspecified: Secondary | ICD-10-CM

## 2016-03-11 NOTE — Progress Notes (Signed)
Quick Note:  MRI shows a compression fracture and a bulging disc. Please return to clinic for further discussion of treatment options including special medicines, injection or bracing. ______

## 2016-03-12 ENCOUNTER — Encounter: Payer: Self-pay | Admitting: Family Medicine

## 2016-03-12 ENCOUNTER — Ambulatory Visit (INDEPENDENT_AMBULATORY_CARE_PROVIDER_SITE_OTHER): Payer: Medicare Other | Admitting: Family Medicine

## 2016-03-12 VITALS — BP 175/89 | HR 65 | Wt 153.0 lb

## 2016-03-12 DIAGNOSIS — IMO0001 Reserved for inherently not codable concepts without codable children: Secondary | ICD-10-CM

## 2016-03-12 DIAGNOSIS — K5903 Drug induced constipation: Secondary | ICD-10-CM

## 2016-03-12 DIAGNOSIS — M4850XA Collapsed vertebra, not elsewhere classified, site unspecified, initial encounter for fracture: Secondary | ICD-10-CM

## 2016-03-12 DIAGNOSIS — M5441 Lumbago with sciatica, right side: Secondary | ICD-10-CM | POA: Diagnosis not present

## 2016-03-12 MED ORDER — POLYETHYLENE GLYCOL 3350 17 GM/SCOOP PO POWD: 17.0000 g | Freq: Every day | ORAL | Status: DC

## 2016-03-12 MED ORDER — CALCITONIN (SALMON) 200 UNIT/ACT NA SOLN: 1.0000 | Freq: Every day | NASAL | Status: AC

## 2016-03-12 MED ORDER — BISACODYL 10 MG RE SUPP: 10.0000 mg | RECTAL | Status: DC | PRN

## 2016-03-12 MED ORDER — HYDROCODONE-ACETAMINOPHEN 5-325 MG PO TABS: 1.0000 | ORAL_TABLET | Freq: Four times a day (QID) | ORAL | Status: DC | PRN

## 2016-03-12 NOTE — Assessment & Plan Note (Signed)
Patient has constipation due to the tramadol therapy. However we haven't increased opiate doses due to pain. Plan to use MiraLAX and Dulcolax. Recheck in a week or 2.

## 2016-03-12 NOTE — Patient Instructions (Signed)
Thank you for coming in today. STOP tramadol.  Start NORCO and Calcitonin.  Follow up in 2 weeks.  Come back or go to the emergency room if you notice new weakness new numbness problems walking or bowel or bladder problems.  Spinal Compression Fracture A spinal compression fracture is a collapse of the bones that form the spine (vertebrae). With this type of fracture, the vertebrae become squashed (compressed) into a wedge shape. Most compression fractures happen in the middle or lower part of the spine. CAUSES This condition may be caused by:  Thinning and loss of density in the bones (osteoporosis). This is the most common cause.  A fall.  A car or motorcycle accident.  Cancer.  Trauma, such as a heavy, direct hit to the head. RISK FACTORS You may be at greater risk for a spinal compression fracture if you:  Are 67 years old or older.  Have osteoporosis.  Have certain types of cancer, including:  Multiple myeloma.  Lymphoma.  Prostate cancer.  Lung cancer.  Breast cancer. SYMPTOMS Symptoms of this condition include:  Severe pain.  Pain that gets worse over time.  Pain that is worse when you stand, walk, sit, or bend.  Sudden pain that is so bad that it is hard for you to move.  Bending or humping of the spine.  Gradual loss of height.  Numbness, tingling, or weakness in the back and legs.  Trouble walking. Your symptoms will depend on the cause of the fracture and how quickly it develops. For example, fractures that are caused by osteoporosis can cause few symptoms, no symptoms, or symptoms that develop slowly over time. DIAGNOSIS This condition may be diagnosed based on symptoms, medical history, and a physical exam. During the physical exam, your health care provider may tap along the length of your spine to check for tenderness. Tests may be done to confirm the diagnosis. They may include:  A bone density test to check for osteoporosis.  Imaging  tests, such as a spine X-ray, a CT scan, or MRI. TREATMENT Treatment for this condition depends on the cause and severity of the condition.Some fractures, such as those that are caused by osteoporosis, may heal on their own with supportive care. This may include:  Pain medicine.  Rest.  A back brace.  Physical therapy exercises.  Medicine that reduces bone pain.  Calcium and vitamin D supplements. Fractures that cause the back to become misshapen, cause nerve pain or weakness, or do not respond to other treatment may be treated with a surgical procedure, such as:  Vertebroplasty. In this procedure, bone cement is injected into the collapsed vertebrae to stabilize them.  Balloon kyphoplasty. In this procedure, the collapsed vertebrae are expanded with a balloon and then bone cement is injected into them.  Spinal fusion. In this procedure, the collapsed vertebrae are connected (fused) to normal vertebrae. HOME CARE INSTRUCTIONS General Instructions  Take medicines only as directed by your health care provider.  Do not drive or operate heavy machinery while taking pain medicine.  If directed, apply ice to the injured area:  Put ice in a plastic bag.  Place a towel between your skin and the bag.  Leave the ice on for 30 minutes every two hours at first. Then apply the ice as needed.  Wear your neck brace or back brace as directed by your health care provider.  Do not drink alcohol. Alcohol can interfere with your treatment.  Keep all follow-up visits as directed by  your health care provider. This is important. It can help to prevent permanent injury, disability, and long-lasting (chronic) pain. Activity  Stay in bed (on bed rest) only as directed by your health care provider. Being on bed rest for too long can make your condition worse.  Return to your normal activities as directed by your health care provider. Ask what activities are safe for you.  Do exercises to  improve motion and strength in your back (physical therapy), as recommended by your health care provider.  Exercise regularly as directed by your health care provider. SEEK MEDICAL CARE IF:  You have a fever.  You develop a cough that makes your pain worse.  Your pain medicine is not helping.  Your pain does not get better over time.  You cannot return to your normal activities as planned or expected. SEEK IMMEDIATE MEDICAL CARE IF:  Your pain is very bad and it suddenly gets worse.  You are unable to move any body part (paralysis) that is below the level of your injury.  You have numbness, tingling, or weakness in any body part that is below the level of your injury.  You cannot control your bladder or bowels.   This information is not intended to replace advice given to you by your health care provider. Make sure you discuss any questions you have with your health care provider.   Document Released: 11/04/2005 Document Revised: 03/21/2015 Document Reviewed: 11/08/2014 Elsevier Interactive Patient Education [employer].

## 2016-03-12 NOTE — Assessment & Plan Note (Addendum)
Patient has radicular pain to her right leg likely from the L4 or L5 nerve root. Refer for epidural steroid injection. Additionally refer to physical therapy. Recheck in 1-2 weeks.  Increase pain control to Norco.

## 2016-03-12 NOTE — Assessment & Plan Note (Signed)
Vertebral compression fracture with pain not controlled by tramadol and NSAIDs alone. Plan to proceed to calcitonin and Norco. Return in one to 2 weeks. If not better will consider further options including vertebral plasty or bracing

## 2016-03-12 NOTE — Progress Notes (Signed)
Morgan Butler is a 65 y.o. female who presents to [facility] today for follow-up back pain. Patient was seen April 19 for back pain. An MRI was obtained which shows a endplate vertebral compression fracture at L4 with L4-L5 disc bulging and possible right foraminal encroachment.   She notes in the interval her pain is worsened despite tramadol. She notes continued worsening right leg pain. Pain radiates to the calf on her right leg. She does note some subjective weakness with hip flexion and knee extension bilaterally. She notes constipation that she attributes to her tramadol. Her last, was 5 days ago. She denies any fevers or chills or recent new injury.   Past Medical History  Diagnosis Date  . Cancer (HCC)     esophageal  . Osteoporosis   . History of intravascular stent placement   . Hypertension    Past Surgical History  Procedure Laterality Date  . Appendectomy  1966  . Abdominal hysterectomy  1995  . Tonsillectomy    . Left shoulder    . Nasal sinus surgery  2009  . Tumor excision    . Coronary stent placement  2014   Social History  Substance Use Topics  . Smoking status: Former Smoker -- 1.00 packs/day for 29 years    Types: Cigarettes    Quit date: 11/18/1993  . Smokeless tobacco: Not on file  . Alcohol Use: No     Comment: rare   family history includes Cancer in her maternal aunt; Heart disease in her maternal aunt; Kidney failure in her mother.  ROS as above Medications: Current Outpatient Prescriptions  Medication Sig Dispense Refill  . acetaminophen (TYLENOL) 650 MG CR tablet Per bottle as needed    . AMBULATORY NON FORMULARY MEDICATION Medication Name: Tdap  IM x 1 1 vial 0  . aspirin 81 MG chewable tablet Chew 81 mg by mouth.    Marland Kitchen atorvastatin (LIPITOR) 40 MG tablet Take 1 tablet (40 mg total) by mouth at bedtime. 90 tablet 3  . Calcium  Carbonate-Vitamin D (OYSTER SHELL CALCIUM/D) 250-125 MG-UNIT TABS Take 1 tablet by mouth daily.    . cholecalciferol (VITAMIN D) 1000 UNITS tablet Take 2,000 Units by mouth daily.    . Multiple Vitamins-Minerals (MULTIVITAMIN WITH MINERALS) tablet Take 1 tablet by mouth daily.    . naproxen (NAPROSYN) 500 MG tablet Take 1 tablet (500 mg total) by mouth 2 (two) times daily with a meal. 30 tablet 0  . omeprazole (PRILOSEC) 40 MG capsule TAKE 1 CAPSULE (40 MG TOTAL) BY MOUTH DAILY. 30 capsule 8  . bisacodyl (DULCOLAX) 10 MG suppository Place 1 suppository (10 mg total) rectally as needed for moderate constipation. 12 suppository 0  . calcitonin, salmon, (MIACALCIN) 200 UNIT/ACT nasal spray Place 1 spray into alternate nostrils daily. 3.7 mL 12  . HYDROcodone-acetaminophen (NORCO/VICODIN) 5-325 MG tablet Take 1 tablet by mouth every 6 (six) hours as needed. 15 tablet 0  . polyethylene glycol powder (GLYCOLAX/MIRALAX) powder Take 17 g by mouth daily. 850 g 1   No current facility-administered medications for this visit.   Allergies  Allergen Reactions  . Sulfa Antibiotics Rash     Exam:  BP 175/89 mmHg  Pulse 65  Wt 153 lb (69.4 kg) Gen: Well NAD HEENT: EOMI,  MMM Lungs: Normal work of breathing. CTABL Heart: RRR no MRG Abd: NABS, Soft. Nondistended, Nontender Exts: Brisk capillary refill, warm and well perfused.  Back: Tender  to spinal midline along the lumbar spine. Tender to palpation bilateral lumbar paraspinal muscles. Decreased lumbar motion. Patient is able to stand from a seated position and can walk with a slow painful gait. She is able to flex her hips and knees with normal gait. She is able to flex her hips and extend her knees against resistance however her strength is 4/5 bilaterally. Sensation is intact throughout.  MRI L-spine dated 03/09/2016 reviewed  CLINICAL DATA: Low back pain for 8 days with lower extremity weakness since a bending injury. Initial  encounter.  EXAM: MRI LUMBAR SPINE WITHOUT CONTRAST  TECHNIQUE: Multiplanar, multisequence MR imaging of the lumbar spine was performed. No intravenous contrast was administered.  COMPARISON: Plain films lumbar spine 03/06/2016.  FINDINGS: There is a mild inferior endplate compression fracture of L4 with vertebral body height loss of approximately 5-10% and associated marrow edema consistent with acute or subacute injury. Mild and remote superior endplate compression fractures of T11, L3 and L4 are also identified. Vertebral body height is otherwise maintained. Alignment is normal. The conus medullaris is normal in signal and position. Imaged intra-abdominal contents are unremarkable.  T11-12 is imaged in the sagittal plane only. There is a shallow central protrusion without central canal or foraminal narrowing.  T12-L1: Negative.  L1-2: Minimal disc bulge without central canal or foraminal narrowing.  L2-3: Mild disc bulge without central canal or foraminal narrowing.  L3-4: Minimal disc bulge without central canal or foraminal narrowing.  L4-5: Shallow broad-based disc bulge, mild facet degenerative disease and ligamentum flavum thickening are seen. There is mild central canal stenosis. Mild to moderate foraminal narrowing is more notable on the right.  L5-S1: Facet degenerative disease and a shallow disc bulge without central canal or foraminal stenosis.  IMPRESSION: Findings consistent with an acute or subacute mild inferior endplate compression fracture of L4.  Mild, remote superior endplate compression fractures of T11, L3 and L4.  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   Electronically Signed  By: Inge Rise M.D.  On: 03/09/2016 14:37   No  results found for this or any previous visit (from the past 24 hour(s)). No results found.   Please see individual assessment and plan sections.

## 2016-03-13 ENCOUNTER — Telehealth: Payer: Self-pay

## 2016-03-13 DIAGNOSIS — M5431 Sciatica, right side: Secondary | ICD-10-CM

## 2016-03-13 NOTE — Telephone Encounter (Signed)
pts husband called stating that the hydrocodone is not helping with her pain and would like to know if there is something else? Please advise.

## 2016-03-14 MED ORDER — OXYCODONE-ACETAMINOPHEN 5-325 MG PO TABS: 1.0000 | ORAL_TABLET | Freq: Three times a day (TID) | ORAL | Status: DC | PRN

## 2016-03-14 NOTE — Addendum Note (Signed)
Addended by: Gregor Hams on: 03/14/2016 04:46 PM   Modules accepted: Orders

## 2016-03-14 NOTE — Telephone Encounter (Signed)
Advised pts husband of rx change and that it has been placed up front for pick up. He advised me that the pt would like to hold off on the back brace for now. He also mentioned that the pt is suppose to be getting epidural injections,  I do not see an order for this. If appropriate please order and i will call to schedule.

## 2016-03-14 NOTE — Telephone Encounter (Signed)
I have ordered the shots and we will check with the radiology clinic about when it will be.

## 2016-03-14 NOTE — Telephone Encounter (Signed)
We will switch to Oxycodone.  We will also order a back brace.  Return to measurements.

## 2016-03-15 ENCOUNTER — Encounter: Payer: Self-pay | Admitting: Family Medicine

## 2016-03-15 ENCOUNTER — Ambulatory Visit (INDEPENDENT_AMBULATORY_CARE_PROVIDER_SITE_OTHER): Payer: Medicare Other | Admitting: Family Medicine

## 2016-03-15 ENCOUNTER — Telehealth: Payer: Self-pay

## 2016-03-15 VITALS — BP 179/78 | HR 75 | Wt 152.0 lb

## 2016-03-15 DIAGNOSIS — K5900 Constipation, unspecified: Secondary | ICD-10-CM | POA: Diagnosis not present

## 2016-03-15 DIAGNOSIS — M4850XA Collapsed vertebra, not elsewhere classified, site unspecified, initial encounter for fracture: Secondary | ICD-10-CM | POA: Diagnosis not present

## 2016-03-15 DIAGNOSIS — K828 Other specified diseases of gallbladder: Secondary | ICD-10-CM | POA: Diagnosis not present

## 2016-03-15 DIAGNOSIS — K5790 Diverticulosis of intestine, part unspecified, without perforation or abscess without bleeding: Secondary | ICD-10-CM | POA: Diagnosis not present

## 2016-03-15 DIAGNOSIS — M5441 Lumbago with sciatica, right side: Secondary | ICD-10-CM | POA: Diagnosis not present

## 2016-03-15 DIAGNOSIS — IMO0001 Reserved for inherently not codable concepts without codable children: Secondary | ICD-10-CM

## 2016-03-15 LAB — BASIC METABOLIC PANEL
BUN: 15 mg/dL (ref 7–25)
CALCIUM: 9.7 mg/dL (ref 8.6–10.4)
CO2: 24 mmol/L (ref 20–31)
CREATININE: 0.67 mg/dL (ref 0.50–0.99)
Chloride: 103 mmol/L (ref 98–110)
GLUCOSE: 98 mg/dL (ref 65–99)
Potassium: 4 mmol/L (ref 3.5–5.3)
SODIUM: 138 mmol/L (ref 135–146)

## 2016-03-15 MED ORDER — OXYCODONE-ACETAMINOPHEN 10-325 MG PO TABS: 1.0000 | ORAL_TABLET | Freq: Four times a day (QID) | ORAL | Status: DC | PRN

## 2016-03-15 MED ORDER — PEG 3350-KCL-NA BICARB-NACL 420 G PO SOLR: 4000.0000 mL | Freq: Once | ORAL | Status: DC

## 2016-03-15 NOTE — Telephone Encounter (Signed)
Pts husband Jemya Vidrio informed me that he would like to contact the pts insurance to confirm coverage before we order a back brace. Advised him to contact me with an update.

## 2016-03-15 NOTE — Patient Instructions (Signed)
Thank you for coming in today. Get labs.  We will call today about where the CT scan will be done.  We will get medicine for constipation.  Take the higher dose of Oxycodone.  Come back or go to the emergency room if you notice new weakness new numbness problems walking or bowel or bladder problems. If your belly pain worsens, or you have high fever, bad vomiting, blood in your stool or black tarry stool go to the Emergency Room.  I will call you tonight or tomorrow with results.

## 2016-03-15 NOTE — Progress Notes (Signed)
Morgan Butler is a 65 y.o. female who presents to [facility] today for back pain and no bowel movement 10 days.  Patient was recently diagnosed with a vertebral compression fracture with right lumbar radicular pain. She notes the pain is severe and worsening. She's tried oxycodone and hydrocodone which have not helped much. She notes oxycodone 5 mg only helps for a few hours. She denies any worsening weakness or numbness. Additionally she's used calcitonin nasal spray for her compression fracture pain which has not helped. Epidural steroid injections have been ordered but not yet scheduled.  Additionally patient notes that she has not had a bowel movement in over 10 days. This preceded the narcotics for pain control. She has been using MiraLAX and bisacodyl suppositories. She notes decreased urgency to have a bowel movement. She does not feel that there is stool in her rectum when she inserts the suppository. She denies any urinary dysfunction or incontinence. She is concerned because she has a history of esophageal cancer with surgery. She is worried she may have a small bowel obstruction or mass of some sort. She notes that she is able to eat and drink normally is not vomiting and is passing a small amount of gas from her rectum.   Past Medical History  Diagnosis Date  . Cancer (HCC)     esophageal  . Osteoporosis   . History of intravascular stent placement   . Hypertension    Past Surgical History  Procedure Laterality Date  . Appendectomy  1966  . Abdominal hysterectomy  1995  . Tonsillectomy    . Left shoulder    . Nasal sinus surgery  2009  . Tumor excision    . Coronary stent placement  2014   Social History  Substance Use Topics  . Smoking status: Former Smoker -- 1.00 packs/day for 29 years    Types: Cigarettes    Quit date: 11/18/1993  . Smokeless tobacco: Not on  file  . Alcohol Use: No     Comment: rare   family history includes Cancer in her maternal aunt; Heart disease in her maternal aunt; Kidney failure in her mother.  ROS as above Medications: Current Outpatient Prescriptions  Medication Sig Dispense Refill  . acetaminophen (TYLENOL) 650 MG CR tablet Per bottle as needed    . AMBULATORY NON FORMULARY MEDICATION Medication Name: Tdap  IM x 1 1 vial 0  . aspirin 81 MG chewable tablet Chew 81 mg by mouth.    Marland Kitchen atorvastatin (LIPITOR) 40 MG tablet Take 1 tablet (40 mg total) by mouth at bedtime. 90 tablet 3  . bisacodyl (DULCOLAX) 10 MG suppository Place 1 suppository (10 mg total) rectally as needed for moderate constipation. 12 suppository 0  . calcitonin, salmon, (MIACALCIN) 200 UNIT/ACT nasal spray Place 1 spray into alternate nostrils daily. 3.7 mL 12  . Calcium Carbonate-Vitamin D (OYSTER SHELL CALCIUM/D) 250-125 MG-UNIT TABS Take 1 tablet by mouth daily.    . cholecalciferol (VITAMIN D) 1000 UNITS tablet Take 2,000 Units by mouth daily.    . Multiple Vitamins-Minerals (MULTIVITAMIN WITH MINERALS) tablet Take 1 tablet by mouth daily.    . naproxen (NAPROSYN) 500 MG tablet Take 1 tablet (500 mg total) by mouth 2 (two) times daily with a meal. 30 tablet 0  . omeprazole (PRILOSEC) 40 MG capsule TAKE 1 CAPSULE (40 MG TOTAL) BY MOUTH DAILY. 30 capsule 8  . polyethylene glycol powder (GLYCOLAX/MIRALAX) powder Take  17 g by mouth daily. 850 g 1  . oxyCODONE-acetaminophen (PERCOCET) 10-325 MG tablet Take 1 tablet by mouth every 6 (six) hours as needed for pain. 45 tablet 0  . polyethylene glycol-electrolytes (NULYTELY/GOLYTELY) 420 g solution Take 4,000 mLs by mouth once. 4000 mL 0   No current facility-administered medications for this visit.   Allergies  Allergen Reactions  . Sulfa Antibiotics Rash     Exam:  BP 179/78 mmHg  Pulse 75  Wt 152 lb (68.947 kg) Gen: Well NAD In pain appearing HEENT: EOMI,  MMM Lungs: Normal work of  breathing. CTABL Heart: RRR no MRG Abd: NABS, Soft. Nondistended, Nontender Exts: Brisk capillary refill, warm and well perfused.  Spine: Tender to spinal midline in the lower lumbar region. Lower extremity strength is equal and normal throughout Rectal exam: Normal-appearing anus. Normal rectal tone. No stool in the vault. Painful gait   No results found for this or any previous visit (from the past 24 hour(s)). No results found.   Please see individual assessment and plan sections.

## 2016-03-15 NOTE — Assessment & Plan Note (Signed)
Constipation/obstipation. Small bowel obstruction is a possibility however patient is not vomiting and she is able to pass gas per her rectum. However she does not have a large firm mass of stool in her rectum that would be consistent with narcotics induced constipation. Given her history of malignancy in the past I feel is reasonable to proceed with CT scan of the abdomen and pelvis with IV and oral contrast. Her last creatinine was about 8 weeks ago and was normal. I have ordered stat basic metabolic panel now and ordered a stat abdomen and pelvis CT scan to be performed today. I will call patient with results.

## 2016-03-15 NOTE — Telephone Encounter (Signed)
Called and scheduled pt to have ct scan at location and time below: [facility] [address], [id number] [place], [place] [address] Phone: [phone] Scheduling: [phone] Scheduling fax: [phone] Check in 1:30p.m. Scan time3:30  Called and dicussed appointment information with pt husband Yaelin Meger. He verbalized understanding.

## 2016-03-15 NOTE — Telephone Encounter (Signed)
Left message with [facility] to schedule pt.

## 2016-03-15 NOTE — Assessment & Plan Note (Signed)
Causes severe pain occurring despite high-dose narcotics and calcitonin. Patient initially declined bracing however I think it's reasonable to proceed with further bracing. I have measured patient for a TLSO and will order it. Additionally I plan to discuss possibility of vertebral plasty or kyphoplasty with the interventional spine radiologist today.

## 2016-03-15 NOTE — Telephone Encounter (Signed)
Notified Dr. Georgina Snell of results of Abd/pelvic CT

## 2016-03-15 NOTE — Assessment & Plan Note (Signed)
Radicular pain likely due to impingement at the L4/L5 nerve roots. Epidural steroid injection pending. Additionally discussed this with the interventional/spine radiologists.

## 2016-03-16 ENCOUNTER — Telehealth: Payer: Self-pay | Admitting: Family Medicine

## 2016-03-16 NOTE — Telephone Encounter (Signed)
I called the patient about her CT scan results from [place].  Her husband is interested in going to the [facility] Address: [address] [place], [place] [address] Phone: [phone] We will investigate Monday

## 2016-03-18 ENCOUNTER — Other Ambulatory Visit: Payer: Self-pay | Admitting: Family Medicine

## 2016-03-18 ENCOUNTER — Telehealth: Payer: Self-pay | Admitting: Family Medicine

## 2016-03-18 DIAGNOSIS — S32000A Wedge compression fracture of unspecified lumbar vertebra, initial encounter for closed fracture: Secondary | ICD-10-CM

## 2016-03-18 NOTE — Telephone Encounter (Signed)
Patient's husband called request to know if Dr. Georgina Snell could call him back when he got a chance he has a question about a Dr. His wife will be seeing. Husband Elberta Fortis) said it is not an emergency and I adv him that Dr. Is in clinic but I will send the message. Thanks

## 2016-03-19 NOTE — Telephone Encounter (Signed)
[facility] Phone: [phone]. Left a vm with kyphoplasty and epidural steroid injection scheduler requesting a call back to discuss how to schedule the pt.

## 2016-03-19 NOTE — Telephone Encounter (Signed)
[facility] Phone: [phone] and was advised that they do both kyphoplasty and epidural steroid injections at that location.

## 2016-03-19 NOTE — Telephone Encounter (Signed)
I called back.   They heard from [facility]. [facility] is unaware about both the kyphoplasty and epidural steroid injection. We will call and clarify.  Additionally the family would like to investigate if    [facility] Address: [address], [place], [place] [address] Phone: [phone]        Can do the procedure as they had good results at that clinic in the past.   We will work on it today.

## 2016-03-20 ENCOUNTER — Ambulatory Visit: Payer: Medicare Other | Admitting: Family Medicine

## 2016-03-20 NOTE — Telephone Encounter (Signed)
[facility] Phone: [phone]. Left a second vm with kyphoplasty and epidural steroid injection scheduler requesting a call back to discuss how to schedule the pt.  Called [facility] and was advised that the pt has an appointment scheduled on 03/21/2016 at 10:30a.m. for epidural injection and 03/26/2016 for a consult on kyphoplasty.   Called and discussed the information above with pt and was advised that the pt would like to go forward with treatment at [facility].

## 2016-03-21 ENCOUNTER — Ambulatory Visit
Admission: RE | Admit: 2016-03-21 | Discharge: 2016-03-21 | Disposition: A | Discharge status: Home / Self Care | Payer: BLUE CROSS/BLUE SHIELD | Source: Ambulatory Visit | Attending: Family Medicine | Admitting: Family Medicine

## 2016-03-21 VITALS — BP 163/66 | HR 63

## 2016-03-21 DIAGNOSIS — M5441 Lumbago with sciatica, right side: Secondary | ICD-10-CM

## 2016-03-21 DIAGNOSIS — M5416 Radiculopathy, lumbar region: Secondary | ICD-10-CM | POA: Diagnosis not present

## 2016-03-21 MED ORDER — IOPAMIDOL (ISOVUE-M 200) INJECTION 41%
1.0000 mL | Freq: Once | INTRAMUSCULAR | Status: AC
  Administered 2016-03-21: 1 mL via EPIDURAL

## 2016-03-21 MED ORDER — METHYLPREDNISOLONE ACETATE 40 MG/ML INJ SUSP (RADIOLOG
120.0000 mg | Freq: Once | INTRAMUSCULAR | Status: AC
  Administered 2016-03-21: 120 mg via EPIDURAL

## 2016-03-21 NOTE — Discharge Instructions (Signed)

## 2016-03-25 ENCOUNTER — Ambulatory Visit: Payer: Medicare Other | Admitting: Family Medicine

## 2016-03-26 ENCOUNTER — Other Ambulatory Visit: Payer: Medicare Other

## 2016-03-29 ENCOUNTER — Encounter: Payer: Self-pay | Admitting: Family Medicine

## 2016-04-03 DIAGNOSIS — M5416 Radiculopathy, lumbar region: Secondary | ICD-10-CM | POA: Diagnosis not present

## 2016-04-03 DIAGNOSIS — M545 Low back pain: Secondary | ICD-10-CM | POA: Diagnosis not present

## 2016-05-03 DIAGNOSIS — M5416 Radiculopathy, lumbar region: Secondary | ICD-10-CM | POA: Diagnosis not present

## 2016-05-22 DIAGNOSIS — J019 Acute sinusitis, unspecified: Secondary | ICD-10-CM | POA: Diagnosis not present

## 2016-05-22 DIAGNOSIS — J40 Bronchitis, not specified as acute or chronic: Secondary | ICD-10-CM | POA: Diagnosis not present

## 2016-05-22 DIAGNOSIS — R0781 Pleurodynia: Secondary | ICD-10-CM | POA: Diagnosis not present

## 2016-05-28 DIAGNOSIS — M5416 Radiculopathy, lumbar region: Secondary | ICD-10-CM | POA: Diagnosis not present

## 2016-05-28 DIAGNOSIS — M545 Low back pain: Secondary | ICD-10-CM | POA: Diagnosis not present

## 2016-05-30 DIAGNOSIS — J90 Pleural effusion, not elsewhere classified: Secondary | ICD-10-CM | POA: Diagnosis not present

## 2016-05-30 DIAGNOSIS — K449 Diaphragmatic hernia without obstruction or gangrene: Secondary | ICD-10-CM | POA: Diagnosis not present

## 2016-05-30 DIAGNOSIS — J40 Bronchitis, not specified as acute or chronic: Secondary | ICD-10-CM | POA: Diagnosis not present

## 2016-05-30 DIAGNOSIS — J329 Chronic sinusitis, unspecified: Secondary | ICD-10-CM | POA: Diagnosis not present

## 2016-05-30 DIAGNOSIS — J9811 Atelectasis: Secondary | ICD-10-CM | POA: Diagnosis not present

## 2016-05-30 DIAGNOSIS — R062 Wheezing: Secondary | ICD-10-CM | POA: Diagnosis not present

## 2016-05-30 DIAGNOSIS — R05 Cough: Secondary | ICD-10-CM | POA: Diagnosis not present

## 2016-06-07 DIAGNOSIS — M5416 Radiculopathy, lumbar region: Secondary | ICD-10-CM | POA: Diagnosis not present

## 2016-06-24 DIAGNOSIS — J324 Chronic pansinusitis: Secondary | ICD-10-CM | POA: Diagnosis not present

## 2016-06-24 DIAGNOSIS — J3489 Other specified disorders of nose and nasal sinuses: Secondary | ICD-10-CM | POA: Diagnosis not present

## 2016-06-25 ENCOUNTER — Ambulatory Visit: Payer: Medicare Other | Admitting: Family Medicine

## 2016-07-02 DIAGNOSIS — M545 Low back pain: Secondary | ICD-10-CM | POA: Diagnosis not present

## 2016-07-02 DIAGNOSIS — M5416 Radiculopathy, lumbar region: Secondary | ICD-10-CM | POA: Diagnosis not present

## 2016-07-08 DIAGNOSIS — M5416 Radiculopathy, lumbar region: Secondary | ICD-10-CM | POA: Diagnosis not present

## 2016-07-21 DIAGNOSIS — J019 Acute sinusitis, unspecified: Secondary | ICD-10-CM | POA: Diagnosis not present

## 2016-07-21 DIAGNOSIS — H1032 Unspecified acute conjunctivitis, left eye: Secondary | ICD-10-CM | POA: Diagnosis not present

## 2016-08-06 DIAGNOSIS — M5416 Radiculopathy, lumbar region: Secondary | ICD-10-CM | POA: Diagnosis not present

## 2016-08-06 DIAGNOSIS — M545 Low back pain: Secondary | ICD-10-CM | POA: Diagnosis not present

## 2016-08-13 DIAGNOSIS — Z23 Encounter for immunization: Secondary | ICD-10-CM | POA: Diagnosis not present

## 2016-08-16 ENCOUNTER — Other Ambulatory Visit: Payer: Self-pay | Admitting: *Deleted

## 2016-08-16 MED ORDER — OMEPRAZOLE 40 MG PO CPDR: DELAYED_RELEASE_CAPSULE | ORAL | 0 refills | Status: DC

## 2016-08-16 NOTE — Progress Notes (Signed)
Request for 90 day  supply

## 2016-09-04 DIAGNOSIS — R05 Cough: Secondary | ICD-10-CM | POA: Diagnosis not present

## 2016-09-04 DIAGNOSIS — R197 Diarrhea, unspecified: Secondary | ICD-10-CM | POA: Diagnosis not present

## 2016-09-04 DIAGNOSIS — J019 Acute sinusitis, unspecified: Secondary | ICD-10-CM | POA: Diagnosis not present

## 2016-09-04 DIAGNOSIS — H6983 Other specified disorders of Eustachian tube, bilateral: Secondary | ICD-10-CM | POA: Diagnosis not present

## 2016-09-11 DIAGNOSIS — M47812 Spondylosis without myelopathy or radiculopathy, cervical region: Secondary | ICD-10-CM | POA: Diagnosis not present

## 2016-09-11 DIAGNOSIS — H548 Legal blindness, as defined in USA: Secondary | ICD-10-CM | POA: Diagnosis not present

## 2016-09-11 DIAGNOSIS — M48061 Spinal stenosis, lumbar region without neurogenic claudication: Secondary | ICD-10-CM | POA: Diagnosis not present

## 2016-09-11 DIAGNOSIS — Z8501 Personal history of malignant neoplasm of esophagus: Secondary | ICD-10-CM | POA: Diagnosis not present

## 2016-09-11 DIAGNOSIS — Z79899 Other long term (current) drug therapy: Secondary | ICD-10-CM | POA: Diagnosis not present

## 2016-09-11 DIAGNOSIS — Z7689 Persons encountering health services in other specified circumstances: Secondary | ICD-10-CM | POA: Diagnosis not present

## 2016-09-11 DIAGNOSIS — I2583 Coronary atherosclerosis due to lipid rich plaque: Secondary | ICD-10-CM | POA: Diagnosis not present

## 2016-09-11 DIAGNOSIS — I2510 Atherosclerotic heart disease of native coronary artery without angina pectoris: Secondary | ICD-10-CM | POA: Diagnosis not present

## 2016-09-11 DIAGNOSIS — K219 Gastro-esophageal reflux disease without esophagitis: Secondary | ICD-10-CM | POA: Diagnosis not present

## 2016-09-11 DIAGNOSIS — Z955 Presence of coronary angioplasty implant and graft: Secondary | ICD-10-CM | POA: Diagnosis not present

## 2016-09-11 DIAGNOSIS — E785 Hyperlipidemia, unspecified: Secondary | ICD-10-CM | POA: Diagnosis not present

## 2016-09-11 DIAGNOSIS — Z780 Asymptomatic menopausal state: Secondary | ICD-10-CM | POA: Diagnosis not present

## 2016-09-17 DIAGNOSIS — M545 Low back pain: Secondary | ICD-10-CM | POA: Diagnosis not present

## 2016-09-19 DIAGNOSIS — Z79899 Other long term (current) drug therapy: Secondary | ICD-10-CM | POA: Diagnosis not present

## 2016-09-19 DIAGNOSIS — E785 Hyperlipidemia, unspecified: Secondary | ICD-10-CM | POA: Diagnosis not present

## 2016-09-25 DIAGNOSIS — Z955 Presence of coronary angioplasty implant and graft: Secondary | ICD-10-CM | POA: Diagnosis not present

## 2016-09-25 DIAGNOSIS — I2583 Coronary atherosclerosis due to lipid rich plaque: Secondary | ICD-10-CM | POA: Diagnosis not present

## 2016-09-25 DIAGNOSIS — I2510 Atherosclerotic heart disease of native coronary artery without angina pectoris: Secondary | ICD-10-CM | POA: Diagnosis not present

## 2016-09-25 DIAGNOSIS — I10 Essential (primary) hypertension: Secondary | ICD-10-CM | POA: Diagnosis not present

## 2016-10-07 DIAGNOSIS — Z780 Asymptomatic menopausal state: Secondary | ICD-10-CM | POA: Diagnosis not present

## 2016-10-07 DIAGNOSIS — M8589 Other specified disorders of bone density and structure, multiple sites: Secondary | ICD-10-CM | POA: Diagnosis not present

## 2016-10-07 DIAGNOSIS — Z13820 Encounter for screening for osteoporosis: Secondary | ICD-10-CM | POA: Diagnosis not present

## 2016-10-23 DIAGNOSIS — K529 Noninfective gastroenteritis and colitis, unspecified: Secondary | ICD-10-CM | POA: Diagnosis not present

## 2016-10-23 DIAGNOSIS — R0981 Nasal congestion: Secondary | ICD-10-CM | POA: Diagnosis not present

## 2016-10-23 DIAGNOSIS — T3695XA Adverse effect of unspecified systemic antibiotic, initial encounter: Secondary | ICD-10-CM | POA: Diagnosis not present

## 2016-10-23 DIAGNOSIS — R05 Cough: Secondary | ICD-10-CM | POA: Diagnosis not present

## 2016-10-23 DIAGNOSIS — R197 Diarrhea, unspecified: Secondary | ICD-10-CM | POA: Diagnosis not present

## 2016-10-23 DIAGNOSIS — K521 Toxic gastroenteritis and colitis: Secondary | ICD-10-CM | POA: Diagnosis not present

## 2016-10-25 DIAGNOSIS — K529 Noninfective gastroenteritis and colitis, unspecified: Secondary | ICD-10-CM | POA: Diagnosis not present

## 2016-11-12 DIAGNOSIS — J0100 Acute maxillary sinusitis, unspecified: Secondary | ICD-10-CM | POA: Diagnosis not present

## 2016-11-14 ENCOUNTER — Other Ambulatory Visit: Payer: Self-pay | Admitting: Family Medicine

## 2016-11-15 DIAGNOSIS — R110 Nausea: Secondary | ICD-10-CM | POA: Diagnosis not present

## 2016-11-15 DIAGNOSIS — H6501 Acute serous otitis media, right ear: Secondary | ICD-10-CM | POA: Diagnosis not present

## 2016-11-15 DIAGNOSIS — J0110 Acute frontal sinusitis, unspecified: Secondary | ICD-10-CM | POA: Diagnosis not present

## 2016-11-15 DIAGNOSIS — D487 Neoplasm of uncertain behavior of other specified sites: Secondary | ICD-10-CM | POA: Diagnosis not present

## 2016-11-15 DIAGNOSIS — R590 Localized enlarged lymph nodes: Secondary | ICD-10-CM | POA: Diagnosis not present
